# Patient Record
Sex: Male | Born: 1955 | Race: White | Hispanic: No | Marital: Married | Smoking: Former smoker
Health system: Southern US, Community
[De-identification: ages and names within clinical notes are randomized; demographics above are authoritative.]

## PROBLEM LIST (undated history)

## (undated) DIAGNOSIS — J45909 Unspecified asthma, uncomplicated: Secondary | ICD-10-CM

## (undated) DIAGNOSIS — F419 Anxiety disorder, unspecified: Secondary | ICD-10-CM

## (undated) DIAGNOSIS — M1990 Unspecified osteoarthritis, unspecified site: Secondary | ICD-10-CM

## (undated) DIAGNOSIS — G4733 Obstructive sleep apnea (adult) (pediatric): Secondary | ICD-10-CM

## (undated) DIAGNOSIS — E119 Type 2 diabetes mellitus without complications: Secondary | ICD-10-CM

## (undated) DIAGNOSIS — R0602 Shortness of breath: Secondary | ICD-10-CM

## (undated) DIAGNOSIS — E039 Hypothyroidism, unspecified: Secondary | ICD-10-CM

## (undated) DIAGNOSIS — Z8719 Personal history of other diseases of the digestive system: Secondary | ICD-10-CM

## (undated) DIAGNOSIS — I10 Essential (primary) hypertension: Secondary | ICD-10-CM

## (undated) DIAGNOSIS — K219 Gastro-esophageal reflux disease without esophagitis: Secondary | ICD-10-CM

## (undated) DIAGNOSIS — E7800 Pure hypercholesterolemia, unspecified: Secondary | ICD-10-CM

## (undated) DIAGNOSIS — F329 Major depressive disorder, single episode, unspecified: Secondary | ICD-10-CM

## (undated) DIAGNOSIS — Z9989 Dependence on other enabling machines and devices: Secondary | ICD-10-CM

## (undated) DIAGNOSIS — J189 Pneumonia, unspecified organism: Secondary | ICD-10-CM

## (undated) DIAGNOSIS — F32A Depression, unspecified: Secondary | ICD-10-CM

## (undated) HISTORY — PX: TONSILLECTOMY: SUR1361

## (undated) HISTORY — PX: CYST EXCISION: SHX5701

## (undated) HISTORY — PX: INGUINAL HERNIA REPAIR: SUR1180

## (undated) HISTORY — PX: COLONOSCOPY: SHX174

---

## 1991-03-31 DIAGNOSIS — J189 Pneumonia, unspecified organism: Secondary | ICD-10-CM

## 1991-03-31 HISTORY — DX: Pneumonia, unspecified organism: J18.9

## 2013-06-22 ENCOUNTER — Encounter (HOSPITAL_COMMUNITY): Payer: Self-pay | Admitting: Emergency Medicine

## 2013-06-22 ENCOUNTER — Encounter (HOSPITAL_COMMUNITY): Payer: Worker's Compensation | Admitting: Anesthesiology

## 2013-06-22 ENCOUNTER — Inpatient Hospital Stay (HOSPITAL_COMMUNITY): Payer: Worker's Compensation

## 2013-06-22 ENCOUNTER — Other Ambulatory Visit: Payer: Self-pay

## 2013-06-22 ENCOUNTER — Emergency Department (HOSPITAL_COMMUNITY): Payer: Worker's Compensation

## 2013-06-22 ENCOUNTER — Inpatient Hospital Stay (HOSPITAL_COMMUNITY)
Admission: EM | Admit: 2013-06-22 | Discharge: 2013-06-28 | DRG: 494 | Disposition: A | Discharge status: Home / Self Care | Payer: Worker's Compensation | Attending: Orthopedic Surgery | Admitting: Orthopedic Surgery

## 2013-06-22 ENCOUNTER — Encounter (HOSPITAL_COMMUNITY)
Admission: EM | Disposition: A | Discharge status: Home / Self Care | Payer: Self-pay | Source: Home / Self Care | Attending: Orthopedic Surgery

## 2013-06-22 ENCOUNTER — Inpatient Hospital Stay (HOSPITAL_COMMUNITY): Payer: Worker's Compensation | Admitting: Anesthesiology

## 2013-06-22 DIAGNOSIS — S82899A Other fracture of unspecified lower leg, initial encounter for closed fracture: Secondary | ICD-10-CM | POA: Diagnosis present

## 2013-06-22 DIAGNOSIS — Z87891 Personal history of nicotine dependence: Secondary | ICD-10-CM | POA: Diagnosis not present

## 2013-06-22 DIAGNOSIS — S43429A Sprain of unspecified rotator cuff capsule, initial encounter: Secondary | ICD-10-CM | POA: Diagnosis present

## 2013-06-22 DIAGNOSIS — S8253XA Displaced fracture of medial malleolus of unspecified tibia, initial encounter for closed fracture: Secondary | ICD-10-CM | POA: Diagnosis present

## 2013-06-22 DIAGNOSIS — S82839A Other fracture of upper and lower end of unspecified fibula, initial encounter for closed fracture: Secondary | ICD-10-CM | POA: Diagnosis present

## 2013-06-22 DIAGNOSIS — Z6832 Body mass index (BMI) 32.0-32.9, adult: Secondary | ICD-10-CM | POA: Diagnosis not present

## 2013-06-22 DIAGNOSIS — I10 Essential (primary) hypertension: Secondary | ICD-10-CM | POA: Diagnosis present

## 2013-06-22 DIAGNOSIS — F3289 Other specified depressive episodes: Secondary | ICD-10-CM | POA: Diagnosis present

## 2013-06-22 DIAGNOSIS — G4733 Obstructive sleep apnea (adult) (pediatric): Secondary | ICD-10-CM | POA: Diagnosis present

## 2013-06-22 DIAGNOSIS — G4700 Insomnia, unspecified: Secondary | ICD-10-CM

## 2013-06-22 DIAGNOSIS — W19XXXA Unspecified fall, initial encounter: Secondary | ICD-10-CM | POA: Diagnosis present

## 2013-06-22 DIAGNOSIS — F32A Depression, unspecified: Secondary | ICD-10-CM

## 2013-06-22 DIAGNOSIS — S82872A Displaced pilon fracture of left tibia, initial encounter for closed fracture: Secondary | ICD-10-CM

## 2013-06-22 DIAGNOSIS — E669 Obesity, unspecified: Secondary | ICD-10-CM | POA: Diagnosis present

## 2013-06-22 DIAGNOSIS — F064 Anxiety disorder due to known physiological condition: Secondary | ICD-10-CM | POA: Diagnosis present

## 2013-06-22 DIAGNOSIS — E7800 Pure hypercholesterolemia, unspecified: Secondary | ICD-10-CM | POA: Diagnosis present

## 2013-06-22 DIAGNOSIS — K219 Gastro-esophageal reflux disease without esophagitis: Secondary | ICD-10-CM | POA: Diagnosis present

## 2013-06-22 DIAGNOSIS — F329 Major depressive disorder, single episode, unspecified: Secondary | ICD-10-CM | POA: Diagnosis present

## 2013-06-22 DIAGNOSIS — F411 Generalized anxiety disorder: Secondary | ICD-10-CM

## 2013-06-22 DIAGNOSIS — S82832A Other fracture of upper and lower end of left fibula, initial encounter for closed fracture: Secondary | ICD-10-CM

## 2013-06-22 DIAGNOSIS — S82892A Other fracture of left lower leg, initial encounter for closed fracture: Secondary | ICD-10-CM

## 2013-06-22 DIAGNOSIS — S82302A Unspecified fracture of lower end of left tibia, initial encounter for closed fracture: Secondary | ICD-10-CM

## 2013-06-22 HISTORY — DX: Depression, unspecified: F32.A

## 2013-06-22 HISTORY — DX: Gastro-esophageal reflux disease without esophagitis: K21.9

## 2013-06-22 HISTORY — DX: Dependence on other enabling machines and devices: Z99.89

## 2013-06-22 HISTORY — PX: EXTERNAL FIXATION LEG: SHX1549

## 2013-06-22 HISTORY — PX: TIBIA FRACTURE SURGERY: SHX806

## 2013-06-22 HISTORY — DX: Hemochromatosis, unspecified: E83.119

## 2013-06-22 HISTORY — DX: Essential (primary) hypertension: I10

## 2013-06-22 HISTORY — DX: Pure hypercholesterolemia, unspecified: E78.00

## 2013-06-22 HISTORY — DX: Major depressive disorder, single episode, unspecified: F32.9

## 2013-06-22 HISTORY — DX: Personal history of other diseases of the digestive system: Z87.19

## 2013-06-22 HISTORY — DX: Obstructive sleep apnea (adult) (pediatric): G47.33

## 2013-06-22 HISTORY — DX: Anxiety disorder, unspecified: F41.9

## 2013-06-22 LAB — PROTIME-INR
INR: 0.93 (ref 0.00–1.49)
PROTHROMBIN TIME: 12.3 s (ref 11.6–15.2)

## 2013-06-22 LAB — URINALYSIS, ROUTINE W REFLEX MICROSCOPIC
Bilirubin Urine: NEGATIVE
Glucose, UA: NEGATIVE mg/dL
Hgb urine dipstick: NEGATIVE
Ketones, ur: NEGATIVE mg/dL
LEUKOCYTES UA: NEGATIVE
Nitrite: NEGATIVE
PROTEIN: NEGATIVE mg/dL
Specific Gravity, Urine: 1.029 (ref 1.005–1.030)
Urobilinogen, UA: 0.2 mg/dL (ref 0.0–1.0)
pH: 5 (ref 5.0–8.0)

## 2013-06-22 LAB — CBC
HCT: 40.6 % (ref 39.0–52.0)
HCT: 41.6 % (ref 39.0–52.0)
Hemoglobin: 14.7 g/dL (ref 13.0–17.0)
Hemoglobin: 14.5 g/dL (ref 13.0–17.0)
MCH: 33.9 pg (ref 26.0–34.0)
MCH: 33.1 pg (ref 26.0–34.0)
MCHC: 36.2 g/dL — ABNORMAL HIGH (ref 30.0–36.0)
MCHC: 34.9 g/dL (ref 30.0–36.0)
MCV: 93.5 fL (ref 78.0–100.0)
MCV: 95 fL (ref 78.0–100.0)
PLATELETS: 199 10*3/uL (ref 150–400)
PLATELETS: 200 10*3/uL (ref 150–400)
RBC: 4.34 MIL/uL (ref 4.22–5.81)
RBC: 4.38 MIL/uL (ref 4.22–5.81)
RDW: 13.5 % (ref 11.5–15.5)
RDW: 13.9 % (ref 11.5–15.5)
WBC: 10.8 10*3/uL — AB (ref 4.0–10.5)
WBC: 12.5 10*3/uL — AB (ref 4.0–10.5)

## 2013-06-22 LAB — TYPE AND SCREEN
ABO/RH(D): A POS
ANTIBODY SCREEN: NEGATIVE

## 2013-06-22 LAB — APTT: APTT: 28 s (ref 24–37)

## 2013-06-22 LAB — BASIC METABOLIC PANEL
BUN: 18 mg/dL (ref 6–23)
CALCIUM: 9.2 mg/dL (ref 8.4–10.5)
CHLORIDE: 103 meq/L (ref 96–112)
CO2: 23 mEq/L (ref 19–32)
Creatinine, Ser: 0.79 mg/dL (ref 0.50–1.35)
GFR calc Af Amer: >90 mL/min (ref >90)
GFR calc non Af Amer: >90 mL/min (ref >90)
Glucose, Bld: 179 mg/dL — ABNORMAL HIGH (ref 70–99)
POTASSIUM: 4.2 meq/L (ref 3.7–5.3)
Sodium: 139 mEq/L (ref 137–147)

## 2013-06-22 LAB — CREATININE, SERUM
CREATININE: 0.77 mg/dL (ref 0.50–1.35)
GFR calc non Af Amer: >90 mL/min (ref >90)

## 2013-06-22 LAB — ABO/RH: ABO/RH(D): A POS

## 2013-06-22 SURGERY — EXTERNAL FIXATION, LOWER EXTREMITY
Anesthesia: General | Site: Ankle | Laterality: Left

## 2013-06-22 MED ORDER — GLYCOPYRROLATE 0.2 MG/ML IJ SOLN
INTRAMUSCULAR | Status: AC
  Filled 2013-06-22: qty 2

## 2013-06-22 MED ORDER — FENTANYL CITRATE 0.05 MG/ML IJ SOLN
INTRAMUSCULAR | Status: DC | PRN
  Administered 2013-06-22 (×2): 50 ug via INTRAVENOUS
  Administered 2013-06-22: 150 ug via INTRAVENOUS

## 2013-06-22 MED ORDER — MEPERIDINE HCL 25 MG/ML IJ SOLN
INTRAMUSCULAR | Status: AC
  Filled 2013-06-22: qty 1

## 2013-06-22 MED ORDER — FENTANYL CITRATE 0.05 MG/ML IJ SOLN
50.0000 ug | Freq: Once | INTRAMUSCULAR | Status: AC
  Administered 2013-06-22: 50 ug via INTRAVENOUS
  Filled 2013-06-22: qty 2

## 2013-06-22 MED ORDER — ONDANSETRON HCL 4 MG PO TABS: 4.0000 mg | ORAL_TABLET | Freq: Four times a day (QID) | ORAL | Status: DC | PRN

## 2013-06-22 MED ORDER — HYDROMORPHONE HCL PF 1 MG/ML IJ SOLN
1.0000 mg | INTRAMUSCULAR | Status: AC | PRN
Start: 2013-06-22 — End: 2013-06-22
  Administered 2013-06-22: 1 mg via INTRAVENOUS
  Filled 2013-06-22: qty 1

## 2013-06-22 MED ORDER — ONDANSETRON HCL 4 MG/2ML IJ SOLN
4.0000 mg | Freq: Four times a day (QID) | INTRAMUSCULAR | Status: DC | PRN
  Administered 2013-06-24: 4 mg via INTRAVENOUS
  Filled 2013-06-22: qty 2

## 2013-06-22 MED ORDER — METHOCARBAMOL 100 MG/ML IJ SOLN: 500.0000 mg | Freq: Four times a day (QID) | INTRAVENOUS | Status: DC | PRN

## 2013-06-22 MED ORDER — CITALOPRAM HYDROBROMIDE 10 MG PO TABS
10.0000 mg | ORAL_TABLET | Freq: Every day | ORAL | Status: DC
  Administered 2013-06-23 – 2013-06-27 (×5): 10 mg via ORAL
  Filled 2013-06-22 (×5): qty 1

## 2013-06-22 MED ORDER — METOCLOPRAMIDE HCL 10 MG PO TABS
5.0000 mg | ORAL_TABLET | Freq: Three times a day (TID) | ORAL | Status: DC | PRN
Start: 2013-06-22 — End: 2013-06-28

## 2013-06-22 MED ORDER — MIDAZOLAM HCL 5 MG/5ML IJ SOLN
INTRAMUSCULAR | Status: DC | PRN
  Administered 2013-06-22: 2 mg via INTRAVENOUS

## 2013-06-22 MED ORDER — MEPERIDINE HCL 25 MG/ML IJ SOLN
6.2500 mg | INTRAMUSCULAR | Status: DC | PRN
Start: 2013-06-22 — End: 2013-06-22
  Administered 2013-06-22: 12.5 mg via INTRAVENOUS

## 2013-06-22 MED ORDER — LACTATED RINGERS IV SOLN
INTRAVENOUS | Status: DC | PRN
  Administered 2013-06-22: 13:00:00 via INTRAVENOUS

## 2013-06-22 MED ORDER — ONDANSETRON HCL 4 MG/2ML IJ SOLN
4.0000 mg | Freq: Once | INTRAMUSCULAR | Status: AC
  Administered 2013-06-22: 4 mg via INTRAVENOUS
  Filled 2013-06-22: qty 2

## 2013-06-22 MED ORDER — LISINOPRIL-HYDROCHLOROTHIAZIDE 20-25 MG PO TABS: 1.0000 | ORAL_TABLET | Freq: Every day | ORAL | Status: DC

## 2013-06-22 MED ORDER — PROPOFOL 10 MG/ML IV BOLUS
INTRAVENOUS | Status: DC | PRN
  Administered 2013-06-22: 200 mg via INTRAVENOUS

## 2013-06-22 MED ORDER — LISINOPRIL 20 MG PO TABS
20.0000 mg | ORAL_TABLET | Freq: Every day | ORAL | Status: DC
  Administered 2013-06-23 – 2013-06-28 (×5): 20 mg via ORAL
  Filled 2013-06-22 (×6): qty 1

## 2013-06-22 MED ORDER — 0.9 % SODIUM CHLORIDE (POUR BTL) OPTIME
TOPICAL | Status: DC | PRN
  Administered 2013-06-22: 1000 mL

## 2013-06-22 MED ORDER — CEFAZOLIN SODIUM-DEXTROSE 2-3 GM-% IV SOLR
2.0000 g | Freq: Three times a day (TID) | INTRAVENOUS | Status: DC
  Administered 2013-06-22 – 2013-06-27 (×14): 2 g via INTRAVENOUS
  Filled 2013-06-22 (×19): qty 50

## 2013-06-22 MED ORDER — FENTANYL CITRATE 0.05 MG/ML IJ SOLN
INTRAMUSCULAR | Status: AC
  Filled 2013-06-22: qty 5

## 2013-06-22 MED ORDER — METHOCARBAMOL 500 MG PO TABS
500.0000 mg | ORAL_TABLET | Freq: Four times a day (QID) | ORAL | Status: DC | PRN
  Filled 2013-06-22 (×2): qty 1

## 2013-06-22 MED ORDER — HYDROMORPHONE HCL PF 1 MG/ML IJ SOLN
1.0000 mg | INTRAMUSCULAR | Status: DC | PRN
  Administered 2013-06-22 – 2013-06-23 (×4): 1 mg via INTRAVENOUS
  Administered 2013-06-23: 2 mg via INTRAVENOUS
  Administered 2013-06-23: 1 mg via INTRAVENOUS
  Administered 2013-06-23: 2 mg via INTRAVENOUS
  Administered 2013-06-23 (×2): 1 mg via INTRAVENOUS
  Administered 2013-06-23 – 2013-06-24 (×4): 2 mg via INTRAVENOUS
  Administered 2013-06-24: 1 mg via INTRAVENOUS
  Administered 2013-06-24: 2 mg via INTRAVENOUS
  Administered 2013-06-24: 1 mg via INTRAVENOUS
  Administered 2013-06-24 (×2): 2 mg via INTRAVENOUS
  Administered 2013-06-25 – 2013-06-26 (×4): 1 mg via INTRAVENOUS
  Filled 2013-06-22 (×2): qty 2
  Filled 2013-06-22 (×5): qty 1
  Filled 2013-06-22 (×3): qty 2
  Filled 2013-06-22 (×5): qty 1
  Filled 2013-06-22 (×2): qty 2
  Filled 2013-06-22 (×3): qty 1
  Filled 2013-06-22 (×3): qty 2

## 2013-06-22 MED ORDER — CEFAZOLIN SODIUM-DEXTROSE 2-3 GM-% IV SOLR: 2.0000 g | INTRAVENOUS | Status: DC

## 2013-06-22 MED ORDER — SODIUM CHLORIDE 0.9 % IV SOLN
Freq: Once | INTRAVENOUS | Status: AC
  Administered 2013-06-22: 07:00:00 via INTRAVENOUS

## 2013-06-22 MED ORDER — METHOCARBAMOL 100 MG/ML IJ SOLN
500.0000 mg | Freq: Four times a day (QID) | INTRAVENOUS | Status: DC | PRN
  Filled 2013-06-22: qty 5

## 2013-06-22 MED ORDER — HYDROMORPHONE HCL PF 1 MG/ML IJ SOLN
INTRAMUSCULAR | Status: AC
  Filled 2013-06-22: qty 1

## 2013-06-22 MED ORDER — OXYCODONE HCL 5 MG PO TABS
ORAL_TABLET | ORAL | Status: AC
  Filled 2013-06-22: qty 2

## 2013-06-22 MED ORDER — ONDANSETRON HCL 4 MG/2ML IJ SOLN: 4.0000 mg | Freq: Four times a day (QID) | INTRAMUSCULAR | Status: DC | PRN

## 2013-06-22 MED ORDER — ONDANSETRON HCL 4 MG/2ML IJ SOLN: 4.0000 mg | Freq: Three times a day (TID) | INTRAMUSCULAR | Status: AC | PRN

## 2013-06-22 MED ORDER — NEOSTIGMINE METHYLSULFATE 1 MG/ML IJ SOLN
INTRAMUSCULAR | Status: DC | PRN
  Administered 2013-06-22: 3 mg via INTRAVENOUS

## 2013-06-22 MED ORDER — HYDROCHLOROTHIAZIDE 25 MG PO TABS
25.0000 mg | ORAL_TABLET | Freq: Every day | ORAL | Status: DC
  Administered 2013-06-23 – 2013-06-28 (×5): 25 mg via ORAL
  Filled 2013-06-22 (×6): qty 1

## 2013-06-22 MED ORDER — ENOXAPARIN SODIUM 40 MG/0.4ML ~~LOC~~ SOLN
40.0000 mg | SUBCUTANEOUS | Status: DC
  Administered 2013-06-22 – 2013-06-27 (×6): 40 mg via SUBCUTANEOUS
  Filled 2013-06-22 (×7): qty 0.4

## 2013-06-22 MED ORDER — LIDOCAINE HCL (CARDIAC) 20 MG/ML IV SOLN
INTRAVENOUS | Status: DC | PRN
  Administered 2013-06-22: 40 mg via INTRAVENOUS

## 2013-06-22 MED ORDER — PROPOFOL 10 MG/ML IV BOLUS
INTRAVENOUS | Status: AC
  Filled 2013-06-22: qty 20

## 2013-06-22 MED ORDER — MORPHINE SULFATE 4 MG/ML IJ SOLN
4.0000 mg | INTRAMUSCULAR | Status: DC | PRN
  Administered 2013-06-22: 4 mg via INTRAVENOUS
  Filled 2013-06-22: qty 1

## 2013-06-22 MED ORDER — HYDROMORPHONE HCL PF 1 MG/ML IJ SOLN
0.2500 mg | INTRAMUSCULAR | Status: DC | PRN
  Administered 2013-06-22: 1 mg via INTRAVENOUS

## 2013-06-22 MED ORDER — ONDANSETRON HCL 4 MG/2ML IJ SOLN
INTRAMUSCULAR | Status: DC | PRN
  Administered 2013-06-22: 4 mg via INTRAVENOUS

## 2013-06-22 MED ORDER — METHOCARBAMOL 500 MG PO TABS
ORAL_TABLET | ORAL | Status: AC
  Filled 2013-06-22: qty 1

## 2013-06-22 MED ORDER — METHOCARBAMOL 500 MG PO TABS
500.0000 mg | ORAL_TABLET | Freq: Four times a day (QID) | ORAL | Status: DC | PRN
  Administered 2013-06-22 – 2013-06-28 (×12): 500 mg via ORAL
  Filled 2013-06-22 (×11): qty 1

## 2013-06-22 MED ORDER — CEFAZOLIN SODIUM-DEXTROSE 2-3 GM-% IV SOLR
INTRAVENOUS | Status: DC | PRN
  Administered 2013-06-22: 2 g via INTRAVENOUS

## 2013-06-22 MED ORDER — SIMVASTATIN 10 MG PO TABS
10.0000 mg | ORAL_TABLET | Freq: Every day | ORAL | Status: DC
  Administered 2013-06-22 – 2013-06-28 (×7): 10 mg via ORAL
  Filled 2013-06-22 (×7): qty 1

## 2013-06-22 MED ORDER — ROCURONIUM BROMIDE 100 MG/10ML IV SOLN
INTRAVENOUS | Status: DC | PRN
  Administered 2013-06-22: 30 mg via INTRAVENOUS

## 2013-06-22 MED ORDER — SODIUM CHLORIDE 0.9 % IV SOLN
INTRAVENOUS | Status: DC
  Administered 2013-06-23: 22:00:00 via INTRAVENOUS

## 2013-06-22 MED ORDER — METOCLOPRAMIDE HCL 5 MG/ML IJ SOLN: 5.0000 mg | Freq: Three times a day (TID) | INTRAMUSCULAR | Status: DC | PRN

## 2013-06-22 MED ORDER — IBUPROFEN 800 MG PO TABS: 800.0000 mg | ORAL_TABLET | Freq: Every day | ORAL | Status: DC | PRN

## 2013-06-22 MED ORDER — MIDAZOLAM HCL 2 MG/2ML IJ SOLN
INTRAMUSCULAR | Status: AC
  Filled 2013-06-22: qty 2

## 2013-06-22 MED ORDER — NEOSTIGMINE METHYLSULFATE 1 MG/ML IJ SOLN
INTRAMUSCULAR | Status: AC
  Filled 2013-06-22: qty 10

## 2013-06-22 MED ORDER — OXYCODONE HCL 5 MG/5ML PO SOLN: 5.0000 mg | Freq: Once | ORAL | Status: DC | PRN

## 2013-06-22 MED ORDER — LIDOCAINE HCL (CARDIAC) 20 MG/ML IV SOLN
INTRAVENOUS | Status: AC
  Filled 2013-06-22: qty 5

## 2013-06-22 MED ORDER — PANTOPRAZOLE SODIUM 40 MG PO TBEC
80.0000 mg | DELAYED_RELEASE_TABLET | Freq: Every day | ORAL | Status: DC
  Administered 2013-06-23 – 2013-06-28 (×6): 80 mg via ORAL
  Filled 2013-06-22 (×4): qty 2

## 2013-06-22 MED ORDER — OXYCODONE HCL 5 MG PO TABS: 5.0000 mg | ORAL_TABLET | Freq: Once | ORAL | Status: DC | PRN

## 2013-06-22 MED ORDER — CHLORHEXIDINE GLUCONATE 4 % EX LIQD: 60.0000 mL | Freq: Once | CUTANEOUS | Status: DC

## 2013-06-22 MED ORDER — ONDANSETRON HCL 4 MG/2ML IJ SOLN
INTRAMUSCULAR | Status: AC
  Filled 2013-06-22: qty 2

## 2013-06-22 MED ORDER — LACTATED RINGERS IV SOLN
INTRAVENOUS | Status: DC
  Administered 2013-06-22: 12:00:00 via INTRAVENOUS

## 2013-06-22 MED ORDER — ROCURONIUM BROMIDE 50 MG/5ML IV SOLN
INTRAVENOUS | Status: AC
  Filled 2013-06-22: qty 1

## 2013-06-22 MED ORDER — OXYCODONE HCL 5 MG PO TABS
5.0000 mg | ORAL_TABLET | ORAL | Status: DC | PRN
  Administered 2013-06-22 – 2013-06-24 (×9): 10 mg via ORAL
  Filled 2013-06-22 (×8): qty 2

## 2013-06-22 MED ORDER — GLYCOPYRROLATE 0.2 MG/ML IJ SOLN
INTRAMUSCULAR | Status: DC | PRN
  Administered 2013-06-22: 0.4 mg via INTRAVENOUS

## 2013-06-22 MED ORDER — SODIUM CHLORIDE 0.9 % IV SOLN: INTRAVENOUS | Status: DC

## 2013-06-22 SURGICAL SUPPLY — 57 items
BANDAGE ELASTIC 4 VELCRO ST LF (GAUZE/BANDAGES/DRESSINGS) ×2 IMPLANT
BANDAGE ELASTIC 6 VELCRO ST LF (GAUZE/BANDAGES/DRESSINGS) ×2 IMPLANT
BANDAGE ESMARK 6X9 LF (GAUZE/BANDAGES/DRESSINGS) ×1 IMPLANT
BANDAGE GAUZE ELAST BULKY 4 IN (GAUZE/BANDAGES/DRESSINGS) ×2 IMPLANT
BIT DRILL 3.2X240 A/O LONG (BIT) ×2 IMPLANT
BNDG ESMARK 6X9 LF (GAUZE/BANDAGES/DRESSINGS) ×2
CLAMP 2 3/5OST (Clamp) ×4 IMPLANT
CLAMP 5 HOLE (Clamp) ×2 IMPLANT
CLAMP PIN-ROD (Clamp) ×4 IMPLANT
CLAMP ROD-ROD (Clamp) ×4 IMPLANT
CUFF TOURNIQUET SINGLE 34IN LL (TOURNIQUET CUFF) IMPLANT
CUFF TOURNIQUET SINGLE 44IN (TOURNIQUET CUFF) IMPLANT
DECANTER SPIKE VIAL GLASS SM (MISCELLANEOUS) ×2 IMPLANT
DRAPE OEC MINIVIEW 54X84 (DRAPES) IMPLANT
DRAPE U-SHAPE 47X51 STRL (DRAPES) ×2 IMPLANT
DRSG ADAPTIC 3X8 NADH LF (GAUZE/BANDAGES/DRESSINGS) ×2 IMPLANT
DRSG PAD ABDOMINAL 8X10 ST (GAUZE/BANDAGES/DRESSINGS) ×2 IMPLANT
DURAPREP 26ML APPLICATOR (WOUND CARE) ×2 IMPLANT
ELECT REM PT RETURN 9FT ADLT (ELECTROSURGICAL) ×2
ELECTRODE REM PT RTRN 9FT ADLT (ELECTROSURGICAL) ×1 IMPLANT
FACESHIELD LNG OPTICON STERILE (SAFETY) ×2 IMPLANT
GAUZE XEROFORM 1X8 LF (GAUZE/BANDAGES/DRESSINGS) ×2 IMPLANT
GLOVE BIOGEL PI ORTHO PRO 7.5 (GLOVE) ×1
GLOVE BIOGEL PI ORTHO PRO SZ8 (GLOVE) ×1
GLOVE ORTHO TXT STRL SZ7.5 (GLOVE) ×2 IMPLANT
GLOVE PI ORTHO PRO STRL 7.5 (GLOVE) ×1 IMPLANT
GLOVE PI ORTHO PRO STRL SZ8 (GLOVE) ×1 IMPLANT
GLOVE SURG ORTHO 8.5 STRL (GLOVE) ×2 IMPLANT
GOWN STRL REUS W/ TWL LRG LVL3 (GOWN DISPOSABLE) ×3 IMPLANT
GOWN STRL REUS W/ TWL XL LVL3 (GOWN DISPOSABLE) ×2 IMPLANT
GOWN STRL REUS W/TWL LRG LVL3 (GOWN DISPOSABLE) ×3
GOWN STRL REUS W/TWL XL LVL3 (GOWN DISPOSABLE) ×2
KIT BASIN OR (CUSTOM PROCEDURE TRAY) ×2 IMPLANT
KIT ROOM TURNOVER OR (KITS) ×2 IMPLANT
MANIFOLD NEPTUNE II (INSTRUMENTS) ×2 IMPLANT
NS IRRIG 1000ML POUR BTL (IV SOLUTION) ×2 IMPLANT
PACK ORTHO EXTREMITY (CUSTOM PROCEDURE TRAY) ×2 IMPLANT
PAD ARMBOARD 7.5X6 YLW CONV (MISCELLANEOUS) ×4 IMPLANT
PAD CAST 4YDX4 CTTN HI CHSV (CAST SUPPLIES) ×2 IMPLANT
PADDING CAST COTTON 4X4 STRL (CAST SUPPLIES) ×2
PIN GUIDE DEPUY (PIN) ×4 IMPLANT
SCREW BONE 5MMX180MM (Screw) ×2 IMPLANT
SCREW BONE SELF DRILL/TAP5X150 (Screw) ×4 IMPLANT
SCREW TRANSFIXING 6X350MM (Screw) ×2 IMPLANT
SPONGE GAUZE 4X4 12PLY (GAUZE/BANDAGES/DRESSINGS) ×2 IMPLANT
SPONGE LAP 4X18 X RAY DECT (DISPOSABLE) ×4 IMPLANT
STAPLER VISISTAT 35W (STAPLE) ×2 IMPLANT
SUCTION FRAZIER TIP 10 FR DISP (SUCTIONS) ×2 IMPLANT
SUT ETHILON 3 0 FSL (SUTURE) ×4 IMPLANT
SUT VIC AB 2-0 CT1 27 (SUTURE) ×2
SUT VIC AB 2-0 CT1 TAPERPNT 27 (SUTURE) ×2 IMPLANT
SUT VIC AB 3-0 CT1 27 (SUTURE) ×1
SUT VIC AB 3-0 CT1 TAPERPNT 27 (SUTURE) ×1 IMPLANT
TOWEL OR 17X24 6PK STRL BLUE (TOWEL DISPOSABLE) ×2 IMPLANT
TOWEL OR 17X26 10 PK STRL BLUE (TOWEL DISPOSABLE) ×2 IMPLANT
TUBE CONNECTING 12X1/4 (SUCTIONS) ×2 IMPLANT
WATER STERILE IRR 1000ML POUR (IV SOLUTION) ×2 IMPLANT

## 2013-06-22 NOTE — Progress Notes (Signed)
Orthopedic Tech Progress Note Patient Details:  Parker Bond 08-10-1955 [id number]  Ortho Devices Type of Ortho Device: Ace wrap;Stirrup splint;Post (short leg) splint Ortho Device/Splint Location: put ohf on bed Ortho Device/Splint Interventions: Ordered;Application   Parker Bond, Parker Bond Craig 06/22/2013, 8:40 PM

## 2013-06-22 NOTE — ED Notes (Signed)
[place], PA back in with pt.

## 2013-06-22 NOTE — Preoperative (Signed)
Beta Blockers   Reason not to administer Beta Blockers:Not Applicable 

## 2013-06-22 NOTE — ED Notes (Signed)
Pt fell out of tractor trailer and thinks he broke his left ankle. Pms intact

## 2013-06-22 NOTE — ED Notes (Signed)
Phlebotomy at the bedside  

## 2013-06-22 NOTE — Anesthesia Preprocedure Evaluation (Signed)
Anesthesia Evaluation  Patient identified by MRN, date of birth, ID band Patient awake    Reviewed: Allergy & Precautions, H&P , NPO status , Patient's Chart, lab work & pertinent test results  Airway Mallampati: II  Neck ROM: full    Dental   Pulmonary sleep apnea ,          Cardiovascular hypertension,     Neuro/Psych    GI/Hepatic   Endo/Other  obese  Renal/GU      Musculoskeletal   Abdominal   Peds  Hematology   Anesthesia Other Findings   Reproductive/Obstetrics                           Anesthesia Physical Anesthesia Plan  ASA: II  Anesthesia Plan: General   Post-op Pain Management:    Induction: Intravenous  Airway Management Planned: LMA  Additional Equipment:   Intra-op Plan:   Post-operative Plan:   Informed Consent: I have reviewed the patients History and Physical, chart, labs and discussed the procedure including the risks, benefits and alternatives for the proposed anesthesia with the patient or authorized representative who has indicated his/her understanding and acceptance.     Plan Discussed with: CRNA, Anesthesiologist and Surgeon  Anesthesia Plan Comments:         Anesthesia Quick Evaluation

## 2013-06-22 NOTE — ED Notes (Signed)
Received report from KeithsburgHayley, Charity fundraiserN.  Pt to transfer to [facility].

## 2013-06-22 NOTE — ED Provider Notes (Signed)
CSN: [id number]     Arrival date & time 06/22/13  0453 History   First MD Initiated Contact with Patient 06/22/13 [phone]     Chief Complaint  Patient presents with  . Ankle Pain     (Consider location/radiation/quality/duration/timing/severity/associated sxs/prior Treatment) HPI  This is a 58 year old male with history of hypertension and sleep apnea presents for evaluation of left ankle injury. Patient was at work earlier today, while climbing up on a step ladder in his truck, he lost balance, and had to jump off in order to avoid falling on his head. He twisted his left ankle in the process, fell down extending right hand to break the fall. He denies hitting his head or loss of consciousness. He complaining of mild tenderness to right hand, but reports significant pain to left ankle. Pain is described as a sharp and throbbing sensation, persistent, worsening with movement. He was unable to bear weight. Denies prior injury to the same ankle. Denies any other injury. Denies any numbness. No precipitating symptoms prior to the fall. No specific treatment tried. This incident happened 2 and half hours ago. Last meal was 4 hrs ago.   Past Medical History  Diagnosis Date  . Hypertension   . Sleep apnea    History reviewed. No pertinent past surgical history. History reviewed. No pertinent family history. History  Substance Use Topics  . Smoking status: Never Smoker   . Smokeless tobacco: Not on file  . Alcohol Use: Yes    Review of Systems  Constitutional: Negative for fever.  Musculoskeletal: Positive for arthralgias.  Skin: Negative for wound.  Neurological: Negative for numbness.      Allergies  Contrast media  Home Medications   Current Outpatient Rx  Name  Route  Sig  Dispense  Refill  . acetaminophen (TYLENOL) 500 MG tablet   Oral   Take 1,000-2,000 mg by mouth 2 (two) times daily as needed for moderate pain.         . citalopram (CELEXA) 10 MG tablet   Oral   Take  10 mg by mouth daily.         Marland Kitchen. ibuprofen (ADVIL,MOTRIN) 200 MG tablet   Oral   Take 800 mg by mouth daily as needed for mild pain.         Marland Kitchen. lisinopril-hydrochlorothiazide (PRINZIDE,ZESTORETIC) 20-25 MG per tablet   Oral   Take 1 tablet by mouth daily.         Marland Kitchen. omeprazole (PRILOSEC) 40 MG capsule   Oral   Take 40 mg by mouth daily.         . pravastatin (PRAVACHOL) 20 MG tablet   Oral   Take 20 mg by mouth daily.          Marland Kitchen. PRESCRIPTION MEDICATION   Oral   Take 1-2 capsules by mouth at bedtime.          BP 143/68  Pulse 82  Temp(Src) 98.1 F (36.7 C) (Oral)  Resp 16  Ht 6' (1.829 m)  Wt 240 lb (108.863 kg)  BMI 32.54 kg/m2  SpO2 96% Physical Exam  Constitutional: He appears well-developed and well-nourished. No distress.  HENT:  Head: Atraumatic.  Eyes: Conjunctivae are normal.  Neck: Normal range of motion. Neck supple.  Musculoskeletal: He exhibits tenderness (L ankle: exquisite tenderness to medial/lateral/posterior malleolar with associate warmth and swelling.  crepitus noted with deformity.  decreased ROM 2/2 pain).  R hand with normal grip, no deformity, no pain to anatomical snuff  box.  No obvious injury.  R wrist with FROM, radial pulse 2+  L ankle with 2+ dorsalis pedis. Sensation intact distal to L ankle injury.  No open laceration.    L knee without tenderness.  ROM not tested due to ankle pain.   Neurological: He is alert.  Skin: No rash noted.  Psychiatric: He has a normal mood and affect.    ED Course  Procedures (including critical care time)  6:57 AM Patient with mechanical injury to left ankle. X-ray demonstrated a severely impacted and comminuted fractures of the distal tibia, with nondisplaced fracture of the tip of the lateral malleolus. This is a closed fracture. Patient is neurovascularly intact. I have consult orthopedic surgeon, Dr. Ranell Patrick, who request for splinting and he will see the patient in the ER for further management.  Care discussed with Dr. Lavella Lemons.  7:53 AM Posterior and stirrup splint applied.  Pt is neurovascularly intact post splinting.  Dr. Ranell Patrick has seen and evaluate pt and will admit for surgical intervention.    Labs Review Labs Reviewed - No data to display Imaging Review Dg Ankle Complete Left  06/22/2013   CLINICAL DATA:  History of fall complaining of ankle pain.  EXAM: LEFT ANKLE COMPLETE - 3+ VIEW  COMPARISON:  No priors.  FINDINGS: Severely comminuted fracture of the distal tibia with multiple fracture fragments which appear impacted and distracted. Fragments are displaced anteriorly, posteriorly, medially and laterally. There is also a nondisplaced fracture of the tip of the lateral malleolus. The talus appears grossly intact. Calcaneus and midfoot are also intact. Soft tissues surrounding the ankle joint are severely swollen.  IMPRESSION: 1. Severely impacted and comminuted fracture of the distal tibia, with nondisplaced fracture of the tip of the lateral malleolus, as above.   Electronically Signed   By: Trudie Reed M.D.   On: 06/22/2013 05:31     EKG Interpretation None      Date: 06/22/2013  Rate: 81  Rhythm: normal sinus rhythm  QRS Axis: normal  Intervals: normal  ST/T Wave abnormalities: normal  Conduction Disutrbances:none  Narrative Interpretation:   Old EKG Reviewed: none available    MDM   Final diagnoses:  Fracture of fibula, distal, left, closed  Fracture of tibia, distal, left, closed    BP 163/92  Pulse 91  Temp(Src) 98.1 F (36.7 C) (Oral)  Resp 20  Ht 6' (1.829 m)  Wt 240 lb (108.863 kg)  BMI 32.54 kg/m2  SpO2 97%  I have reviewed nursing notes and vital signs. I personally reviewed the imaging tests through PACS system  I reviewed available ER/hospitalization records thought the EMR     Fayrene Helper, PA-C 06/22/13 0754  Fayrene Helper, PA-C 06/24/13 [phone]

## 2013-06-22 NOTE — H&P (Signed)
Subjective:   Patient is a 58 y.o. male presents with acute fall and left ankle pain and deformity. Patient complained of pain and inability to bear weight on the left.  Fall about 10 feet.  Patient Active Problem List   Diagnosis Date Noted  . Closed left ankle fracture 06/22/2013  . Closed pilon fracture of left tibia 06/22/2013   Past Medical History  Diagnosis Date  . Hypertension   . Sleep apnea     History reviewed. No pertinent past surgical history.   (Not in a hospital admission) Allergies  Allergen Reactions  . Contrast Media [Iodinated Diagnostic Agents] Rash    History  Substance Use Topics  . Smoking status: Never Smoker   . Smokeless tobacco: Not on file  . Alcohol Use: Yes    History reviewed. No pertinent family history.  Review of Systems Pertinent items are noted in HPI.  Objective:   Patient Vitals for the past 8 hrs:  BP Temp Temp src Pulse Resp SpO2 Height Weight  06/22/13 0744 163/92 mmHg - - 91 20 97 % - -  06/22/13 0546 143/68 mmHg - - 82 16 96 % - -  06/22/13 0500 143/87 mmHg 98.1 F (36.7 C) Oral 86 16 98 % 6' (1.829 m) 108.863 kg (240 lb)          exam:  AAO, no neck tenderness, normal ROM, no tenderness T and L spine  Bilateral UEs with normal ROM and strength, chest with normal excursion and no tenderness, abdomen soft and nontender.  Right LE with normal ROM,  Left ankle swollen and deformed, skin intact, no blisters, wiggles toes  ECG: pending.  Data Review  Assessment:   Active Problems:   Closed left ankle fracture   Closed pilon fracture of left tibia   Plan:   Closed reduction and splinting performed with hematoma block.  Plan EX Fix to regain length later today. Have discussed with Dr Toni ArthursJohn Hewitt, foot and ankle specialist.  Patient understands and agrees with the plan.

## 2013-06-22 NOTE — Progress Notes (Signed)
Orthopedic Tech Progress Note Patient Details:  Parker Bond Feb 07, 1956 [id number]  Ortho Devices Type of Ortho Device: Ace wrap;Stirrup splint;Post (short leg) splint Ortho Device/Splint Interventions: Application   Haskell Flirtewsome, Abbott Jasinski M 06/22/2013, 7:57 AM

## 2013-06-22 NOTE — Transfer of Care (Signed)
Immediate Anesthesia Transfer of Care Note  Patient: Parker Bond  Procedure(s) Performed: Procedure(s):  APPLICATION OF EXTERNAL FIXATION FOR LEFT PILON FRACTURE (Left)  Patient Location: PACU  Anesthesia Type:General  Level of Consciousness: awake, alert , oriented and patient cooperative  Airway & Oxygen Therapy: Patient Spontanous Breathing and Patient connected to nasal cannula oxygen  Post-op Assessment: Report given to PACU RN, Post -op Vital signs reviewed and stable and Patient moving all extremities  Post vital signs: Reviewed and stable  Complications: No apparent anesthesia complications

## 2013-06-22 NOTE — ED Notes (Signed)
Ortho tech called for short leg splint to left leg

## 2013-06-22 NOTE — Brief Op Note (Signed)
06/22/2013  1:50 PM  PATIENT:  Parker Bond  58 y.o. male  PRE-OPERATIVE DIAGNOSIS:  left PILON FRACTURE, comminuted and displaced  POST-OPERATIVE DIAGNOSIS:  left PILON FRACTURE, comminuted and displaced  PROCEDURE:  Procedure(s):  APPLICATION OF EXTERNAL FIXATION FOR LEFT PILON FRACTURE (Left), closed reduction  SURGEON:  Surgeon(s) and Role:    * Verlee RossettiSteven R Vittorio Mohs, MD - Primary  PHYSICIAN ASSISTANT:   ASSISTANTS: none   ANESTHESIA:   general  EBL:  Total I/O In: 700 [I.V.:700] Out: -   BLOOD ADMINISTERED:none  DRAINS: none   LOCAL MEDICATIONS USED:  NONE  SPECIMEN:  No Specimen  DISPOSITION OF SPECIMEN:  N/A  COUNTS:  YES  TOURNIQUET:  * No tourniquets in log *  DICTATION: .Other Dictation: Dictation Number [id number]  PLAN OF CARE: Admit to inpatient   PATIENT DISPOSITION:  PACU - hemodynamically stable.   Delay start of Pharmacological VTE agent (>24hrs) due to surgical blood loss or risk of bleeding: no

## 2013-06-22 NOTE — Progress Notes (Signed)
Utilization review completed.  

## 2013-06-23 ENCOUNTER — Encounter (HOSPITAL_COMMUNITY): Payer: Self-pay | Admitting: Orthopedic Surgery

## 2013-06-23 NOTE — Progress Notes (Signed)
Pt. Is already on his home CPAP via his FFM from home with 4L O2 bled in. Pt. Is tolerating CPAP well at this time without any complications.

## 2013-06-23 NOTE — Care Management Note (Signed)
CARE MANAGEMENT NOTE 06/23/2013  Patient:  Parker Bond,Parker Bond   Account Number:  [id number]  Date Initiated:  06/23/2013  Documentation initiated by:  Vance PeperBRADY,Nicklas Mcsweeney  Subjective/Objective Assessment:   58 yr old male admitted with a left closed pilon fracture, left distal tibia fracture.     Action/Plan:   Patient had closed reduction with palcement of external fixator, returning to OR today. CM will continue to monitor.   Anticipated DC Date:     Anticipated DC Plan:           Choice offered to / List presented to:             Status of service:   Medicare Important Message given?   (If response is "NO", the following Medicare IM given date fields will be blank) Date Medicare IM given:   Date Additional Medicare IM given:    Discharge Disposition:    Per UR Regulation:    If discussed at Long Length of Stay Meetings, dates discussed:    Comments:  06/23/13 10:00am  Vance PeperSusan Haneen Bernales, RN BSN Case Manager [phone] Patient is under worker's comp. Called Mindi JunkerMarsha Renigar [phone] and left a message. Will need to arrange for home health and DME.

## 2013-06-23 NOTE — Anesthesia Postprocedure Evaluation (Signed)
  Anesthesia Post-op Note  Patient: Parker Bond  Procedure(s) Performed: Procedure(s):  APPLICATION OF EXTERNAL FIXATION FOR LEFT PILON FRACTURE (Left)  Patient Location: PACU  Anesthesia Type:General  Level of Consciousness: awake and alert   Airway and Oxygen Therapy: Patient Spontanous Breathing  Post-op Pain: moderate  Post-op Assessment: Post-op Vital signs reviewed, Patient's Cardiovascular Status Stable and Respiratory Function Stable  Post-op Vital Signs: Reviewed  Filed Vitals:   06/23/13 0610  BP: 153/85  Pulse: 92  Temp: 36.8 C  Resp: 18    Complications: No apparent anesthesia complications

## 2013-06-23 NOTE — Op Note (Signed)
Parker Bond:  Parker Bond, Parker Bond               ACCOUNT NO.:  [id number]  MEDICAL RECORD NO.:  [id number]  LOCATION:  XRAY                         FACILITY:  [facility]  PHYSICIAN:  Almedia BallsSteven R. Ranell PatrickNorris, M.D. DATE OF BIRTH:  10/12/1955  DATE OF PROCEDURE:  06/22/2013 DATE OF DISCHARGE:                              OPERATIVE REPORT   PREOPERATIVE DIAGNOSIS:  Left comminuted displaced tibial Pilon fracture.  POSTOPERATIVE DIAGNOSES:  Left comminuted displaced tibial Pilon fracture as well as proximal fibular fracture.  ATTENDING SURGEON:  Almedia BallsSteven R. Ranell PatrickNorris, MD.  ASSISTANT:  None.  ANESTHESIA:  General anesthesia via endotracheal tube was used.  ESTIMATED BLOOD LOSS:  Minimal.  FLUID REPLACEMENT:  1000 mL crystalloid.  INSTRUMENT COUNTS:  Correct.  COMPLICATIONS:  There were no complications.  ANTIBIOTICS:  Perioperative antibiotics were given.  INDICATIONS:  The patient is a 58 year old male who fell on the job approximately 10 feet injuring his left ankle.  The patient presented with a severely comminuted and displaced Pilon fracture of his ankle with significant disruption of the ankle mortise.  The patient's ankle was significantly shortened by this injury.  There is gross deformity present, unfortunately this was closed, and neurovascular intact.  I counseled the patient regarding the need to close reduce this ankle fracture under general anesthesia and get him back at a length with an external fixator which be a temporizing measure to allow for CT evaluation of the Pilon fracture and eventual open reduction and internal fixation, I consulted Dr. Toni ArthursJohn Hewitt, foot and ankle specialist, regarding plan of care, and he is on board.  The patient agreed and family agreed, informed consent was obtained.  DESCRIPTION OF PROCEDURE:  After adequate level of anesthesia was achieved, the patient was positioned in supine on the radiolucent operating table.  After sterile prep and drape of the leg, a  time-out was called.  We then utilized C-arm arthroscopy in multiple planes to correctly place a centrally threaded pin in the calcaneus, was a transverse pin, and then we also placed two 6.5 Schanz pins in the tibia which were bicortical.  We then went ahead and applied a Biomet external fixture, this is our large external fixator in a delta frame format. The patient's ankle was reduced under longitudinal traction with multiplanar C-arm.  We were able to reestablish the length of the fracture which was severely comminuted, but the ankle mortise was in much better position both on the AP and lateral views.  We then tightened up the pin to pin clamps and the pin to bar clamps and obtained a satisfactory stabilization of the fracture in an adequate position.  The skin was actually in better condition.  Once we finished that, we irrigated the wounds.  We went ahead and wrapped the pins in Xeroform gauze and then 4-inch Kerlix, and then applied an Ace wrap around the entire foot and ankle.  The patient was transported to recovery room in stable condition having tolerated the procedure well.     Almedia BallsSteven R. Ranell PatrickNorris, M.D.     SRN/MEDQ  D:  06/22/2013  T:  06/23/2013  Job:  [id number]

## 2013-06-23 NOTE — Progress Notes (Signed)
Orthopedics Progress Note  Subjective: I am hurting, the foot hurts  Objective:  Filed Vitals:   06/23/13 0610  BP: 153/85  Pulse: 92  Temp: 98.3 F (36.8 C)  Resp: 18    General: Awake and alert  Musculoskeletal: left leg swollen but compartments not tense,  Able to actively move the toes and the foot with assistance Neurovascularly intact  Lab Results  Component Value Date   WBC 12.5* 06/22/2013   HGB 14.5 06/22/2013   HCT 41.6 06/22/2013   MCV 95.0 06/22/2013   PLT 200 06/22/2013       Component Value Date/Time   NA 139 06/22/2013 0812   K 4.2 06/22/2013 0812   CL 103 06/22/2013 0812   CO2 23 06/22/2013 0812   GLUCOSE 179* 06/22/2013 0812   BUN 18 06/22/2013 0812   CREATININE 0.77 06/22/2013 1618   CALCIUM 9.2 06/22/2013 0812   GFRNONAA >90 06/22/2013 1618   GFRAA >90 06/22/2013 1618    Lab Results  Component Value Date   INR 0.93 06/22/2013    Assessment/Plan: POD #1 s/p Procedure(s):  APPLICATION OF EXTERNAL FIXATION FOR LEFT PILON FRACTURE Patient remains too swollen for surgery.  Pain wise and to keep him elevated he will need to remain in house through the weekend . Re Diet  Almedia BallsSteven R. Ranell PatrickNorris, MD 06/23/2013 1:30 PM

## 2013-06-24 MED ORDER — BISACODYL 10 MG RE SUPP
10.0000 mg | Freq: Every day | RECTAL | Status: DC | PRN
  Administered 2013-06-26 – 2013-06-27 (×2): 10 mg via RECTAL
  Filled 2013-06-24 (×2): qty 1

## 2013-06-24 MED ORDER — ALUM & MAG HYDROXIDE-SIMETH 200-200-20 MG/5ML PO SUSP
30.0000 mL | Freq: Four times a day (QID) | ORAL | Status: DC | PRN
  Administered 2013-06-24 – 2013-06-28 (×2): 30 mL via ORAL
  Filled 2013-06-24 (×2): qty 30

## 2013-06-24 MED ORDER — SENNA 8.6 MG PO TABS
2.0000 | ORAL_TABLET | Freq: Two times a day (BID) | ORAL | Status: DC
  Administered 2013-06-24 – 2013-06-28 (×9): 17.2 mg via ORAL
  Filled 2013-06-24 (×10): qty 2

## 2013-06-24 MED ORDER — OXYCODONE HCL 5 MG PO TABS
5.0000 mg | ORAL_TABLET | ORAL | Status: DC | PRN
  Administered 2013-06-24 – 2013-06-26 (×7): 10 mg via ORAL
  Administered 2013-06-27 (×2): 5 mg via ORAL
  Administered 2013-06-27: 10 mg via ORAL
  Administered 2013-06-27: 5 mg via ORAL
  Administered 2013-06-27 – 2013-06-28 (×3): 10 mg via ORAL
  Filled 2013-06-24: qty 1
  Filled 2013-06-24 (×4): qty 2
  Filled 2013-06-24: qty 1
  Filled 2013-06-24 (×9): qty 2

## 2013-06-24 MED ORDER — DOCUSATE SODIUM 100 MG PO CAPS
100.0000 mg | ORAL_CAPSULE | Freq: Two times a day (BID) | ORAL | Status: DC
  Administered 2013-06-24 – 2013-06-28 (×9): 100 mg via ORAL
  Filled 2013-06-24 (×9): qty 1

## 2013-06-24 NOTE — Progress Notes (Signed)
Parker Bond  MRN: [id number] DOB/Age: [birth year] 58 y.o. Physician: Lynnea MaizesK Parker Bond, M.D. 2 Days Post-Op Procedure(s) (LRB):  APPLICATION OF EXTERNAL FIXATION FOR LEFT PILON FRACTURE (Left)  Subjective: Resting comfortably with pain meds. Vital Signs Temp:  [98.1 F (36.7 C)-98.4 F (36.9 C)] 98.4 F (36.9 C) (03/28 0522) Pulse Rate:  [81-96] 96 (03/28 0522) Resp:  [18] 18 (03/28 0522) BP: (98-139)/(61-83) 98/64 mmHg (03/28 1032) SpO2:  [94 %-100 %] 96 % (03/28 0522)  Lab Results  Recent Labs  06/22/13 0812 06/22/13 1618  WBC 10.8* 12.5*  HGB 14.7 14.5  HCT 40.6 41.6  PLT 199 200   BMET  Recent Labs  06/22/13 0812 06/22/13 1618  NA 139  --   K 4.2  --   CL 103  --   CO2 23  --   GLUCOSE 179*  --   BUN 18  --   CREATININE 0.79 0.77  CALCIUM 9.2  --    INR  Date Value Ref Range Status  06/22/2013 0.93  0.00 - 1.49 Final     Exam  Proximal pin site dressings changed, no active bleeding. EHL 3/5, FHL3/5. Distal pin dressings dry.  Plan Continue elevation, edema reduction. PT to teach [email or web address]  Keviana Guida M 06/24/2013, 11:01 AM

## 2013-06-24 NOTE — Progress Notes (Signed)
Subjective: 2 Days Post-Op Procedure(s) (LRB):  APPLICATION OF EXTERNAL FIXATION FOR LEFT PILON FRACTURE (Left) Patient reports pain as severe at times.  Generally well controlled.  No n/v/f/c.  No BM.  Passing some gas.  Objective: Vital signs in last 24 hours: Temp:  [98.1 F (36.7 C)-98.4 F (36.9 C)] 98.4 F (36.9 C) (03/28 0522) Pulse Rate:  [81-96] 96 (03/28 0522) Resp:  [18] 18 (03/28 0522) BP: (98-139)/(61-83) 98/64 mmHg (03/28 1032) SpO2:  [94 %-100 %] 96 % (03/28 0522)  Intake/Output from previous day: 03/27 0701 - 03/28 0700 In: 360 [P.O.:360] Out: 825 [Urine:825] Intake/Output this shift: Total I/O In: 240 [P.O.:240] Out: -    Recent Labs  06/22/13 0812 06/22/13 1618  HGB 14.7 14.5    Recent Labs  06/22/13 0812 06/22/13 1618  WBC 10.8* 12.5*  RBC 4.34 4.38  HCT 40.6 41.6  PLT 199 200    Recent Labs  06/22/13 0812 06/22/13 1618  NA 139  --   K 4.2  --   CL 103  --   CO2 23  --   BUN 18  --   CREATININE 0.79 0.77  GLUCOSE 179*  --   CALCIUM 9.2  --     Recent Labs  06/22/13 0812  INR 0.93    PE:  wn wd male in nad.  L LE with ex fix in place.  Pin tracts appear clean and healthy.  Assessment/Plan: 2 Days Post-Op Procedure(s) (LRB):  APPLICATION OF EXTERNAL FIXATION FOR LEFT PILON FRACTURE (Left) Up with therapy  nwb on L LE.  Elevate for pain control and to treat swelling.  Pt continues to require inpatient stay due to severe pain requiring IV narcotic pain medicine.  Parker Bond, Parker Bond 06/24/2013, 12:33 PM

## 2013-06-24 NOTE — Evaluation (Signed)
Physical Therapy Evaluation Patient Details Name: Parker Bond MRN: [id number] DOB: 03-07-1956 Today's Date: 06/24/2013   History of Present Illness  admitted after fall from ~10 feet sustaining comminuted L tibial/pylon fx, s/p external fixation.  Clinical Impression  Pt admitted after fall with L pylon comminuted fx, s/p ext. fixation.  Pt currently limited functionally due to the problems listed below.  (see problems list.)  Pt will benefit from PT to maximize function and safety to be able to get home safely with available assist of wife and family.     Follow Up Recommendations Home health PT;Supervision for mobility/OOB    Equipment Recommendations  Rolling walker with 5" wheels (?tub or shower seat)    Recommendations for Other Services       Precautions / Restrictions Restrictions Weight Bearing Restrictions: Yes LLE Weight Bearing: Non weight bearing      Mobility  Bed Mobility Overal bed mobility: Needs Assistance Bed Mobility: Supine to Sit;Sit to Supine     Supine to sit: Min assist;+2 for safety/equipment Sit to supine: Min assist;+2 for safety/equipment   General bed mobility comments: assist L LE only  Transfers Overall transfer level: Needs assistance Equipment used: Rolling walker (2 wheeled) Transfers: Sit to/from Stand Sit to Stand: Mod assist;+2 safety/equipment         General transfer comment: lifting and stability assist  Ambulation/Gait                Stairs            Wheelchair Mobility    Modified Rankin (Stroke Patients Only)       Balance Overall balance assessment: Needs assistance Sitting-balance support: No upper extremity supported;Feet supported Sitting balance-Leahy Scale: Fair     Standing balance support: Bilateral upper extremity supported;During functional activity Standing balance-Leahy Scale: Poor Standing balance comment: used RW--stood well, but assisted due to situation and dizzy.                     Pertinent Vitals/Pain Pain not rated, but pain meds asked for and given    Home Living Family/patient expects to be discharged to:: Private residence Living Arrangements: Spouse/significant other Available Help at Discharge: Family;Available 24 hours/day Type of Home: House Home Access: Ramped entrance     Home Layout: One level Home Equipment: Wheelchair - power;Wheelchair - manual;Bedside commode      Prior Function                 Hand Dominance        Extremity/Trunk Assessment   Upper Extremity Assessment: Overall WFL for tasks assessed           Lower Extremity Assessment: Overall WFL for tasks assessed;LLE deficits/detail   LLE Deficits / Details: pt able to lift leg against gravity     Communication      Cognition Arousal/Alertness: Awake/alert Behavior During Therapy: WFL for tasks assessed/performed Overall Cognitive Status: Within Functional Limits for tasks assessed                      General Comments General comments (skin integrity, edema, etc.): pt reports dizziness and spinning with movement that quickly starts resolving when still.  Consider BPPV    Exercises        Assessment/Plan    PT Assessment Patient needs continued PT services  PT Diagnosis Difficulty walking;Acute pain   PT Problem List Decreased activity tolerance;Decreased balance;Decreased mobility;Decreased knowledge of precautions;Pain;Decreased strength  PT Treatment  Interventions DME instruction;Gait training;Functional mobility training;Therapeutic activities;Therapeutic exercise;Patient/family education   PT Goals (Current goals can be found in the Care Plan section) Acute Rehab PT Goals Patient Stated Goal: home,  eventually back to work PT Goal Formulation: With patient Time For Goal Achievement: 07/08/13 Potential to Achieve Goals: Good    Frequency Min 5X/week   Barriers to discharge        End of Session   Activity  Tolerance: Patient tolerated treatment well (limited by nausea) Patient left: in bed;with call bell/phone within reach;with family/visitor present         Time: [id number] PT Time Calculation (min): 35 min   Charges:   PT Evaluation $Initial PT Evaluation Tier I: 1 Procedure PT Treatments $Therapeutic Activity: 23-37 mins   PT G Codes:          Hailley Byers, Eliseo GumKenneth V 06/24/2013, 4:55 PM  06/24/2013  Edmonds BingKen Jeena Arnett, PT [phone]  (pager)

## 2013-06-25 ENCOUNTER — Inpatient Hospital Stay (HOSPITAL_COMMUNITY): Payer: Worker's Compensation

## 2013-06-25 MED ORDER — HYDROCODONE-ACETAMINOPHEN 5-325 MG PO TABS
1.0000 | ORAL_TABLET | Freq: Four times a day (QID) | ORAL | Status: DC | PRN
  Administered 2013-06-25: 2 via ORAL
  Administered 2013-06-25: 1 via ORAL
  Administered 2013-06-26: 2 via ORAL
  Filled 2013-06-25 (×4): qty 2

## 2013-06-25 NOTE — ED Provider Notes (Signed)
Medical screening examination/treatment/procedure(s) were performed by non-physician practitioner and as supervising physician I was immediately available for consultation/collaboration.   EKG Interpretation   Date/Time:  Thursday June 22 2013 08:38:52 EDT Ventricular Rate:  81 PR Interval:  157 QRS Duration: 96 QT Interval:  401 QTC Calculation: 465 R Axis:   37 Text Interpretation:  Sinus rhythm ED PHYSICIAN INTERPRETATION AVAILABLE  IN [facility] Confirmed by TEST, Record ([id number]) on 06/24/2013 1:43:26  PM        Brandt LoosenJulie Manly, MD 06/25/13 [phone]

## 2013-06-25 NOTE — Progress Notes (Signed)
Subjective: 3 Days Post-Op Procedure(s) (LRB):  APPLICATION OF EXTERNAL FIXATION FOR LEFT PILON FRACTURE (Left) Patient reports pain as 3 on 0-10 scale.  Has not had PT and is not ready for DC today. Needs reevaluated in A.M.  Objective: Vital signs in last 24 hours: Temp:  [97.6 F (36.4 C)-98.5 F (36.9 C)] 98.5 F (36.9 C) (03/29 0655) Pulse Rate:  [95-98] 98 (03/29 0655) Resp:  [18] 18 (03/29 0655) BP: (98-158)/(55-84) 158/84 mmHg (03/29 0655) SpO2:  [94 %-96 %] 95 % (03/29 0655)  Intake/Output from previous day: 03/28 0701 - 03/29 0700 In: 1790 [P.O.:1440; IV Piggyback:350] Out: 1450 [Urine:1450] Intake/Output this shift:     Recent Labs  06/22/13 1618  HGB 14.5    Recent Labs  06/22/13 1618  WBC 12.5*  RBC 4.38  HCT 41.6  PLT 200    Recent Labs  06/22/13 1618  CREATININE 0.77   No results found for this basename: LABPT, INR,  in the last 72 hours  No major issues except for above.  Assessment/Plan: 3 Days Post-Op Procedure(s) (LRB):  APPLICATION OF EXTERNAL FIXATION FOR LEFT PILON FRACTURE (Left) Up with therapy  Xena Propst A 06/25/2013, 8:16 AM

## 2013-06-25 NOTE — Progress Notes (Signed)
Physical Therapy Treatment Patient Details Name: Parker Bond MRN: [id number] DOB: 05/11/1955 Today's Date: 06/25/2013    History of Present Illness admitted after fall from ~10 feet sustaining comminuted L tibial/pylon fx, s/p external fixation.    PT Comments    Pt seen for second PT visit this pm.  Pt with fair pain control to 6/10 at rest at left ankle, however when attempting to sit at EOB pt experienced excruciating pain and could not tolerate.  Returned to supine and positioned comfortable with HOB at 45 degrees. Minimal ability to wiggle toes or active quad. Severe pain limiting mobility, pt unsafe to d/c home at this time.  Left shoulder assessed following (-) xray noting pain and weakness with forward flexion.  Recommend ice modality and consider further imaging if no improvement noted.  PT continuing to follow acute.  Follow Up Recommendations        Equipment Recommendations       Recommendations for Other Services       Precautions / Restrictions Restrictions LLE Weight Bearing: Non weight bearing Other Position/Activity Restrictions: elevate left leg    Mobility  Bed Mobility Overal bed mobility: Needs Assistance Bed Mobility: Supine to Sit;Sit to Supine     Supine to sit: Max assist;+2 for physical assistance;HOB elevated Sit to supine: Max assist;+2 for physical assistance;HOB elevated   General bed mobility comments: much more limited today, requiring L LE management and trunk assist to attempt to sit at EOB; unable to fully come to sitting due to EXCRUTIATING pain in ankle, assisted back to supine and repostioned with foot again elevated, ice pack to foot (x20 mintues) and HOB returned to 45 degrees.  Transfers Overall transfer level:  (unable today)                  Ambulation/Gait                 Stairs            Wheelchair Mobility    Modified Rankin (Stroke Patients Only)       Balance                                     Cognition Arousal/Alertness: Awake/alert Behavior During Therapy: WFL for tasks assessed/performed Overall Cognitive Status: Within Functional Limits for tasks assessed       Memory: Decreased recall of precautions (suspect due to medication)              Exercises      General Comments General comments (skin integrity, edema, etc.): anterior/superior most pin with mild bleeding during therapy.  Stops immediately.  Noted Left shoulder xray (-) for fracture.  Pt described MOI as compression during fall.  Assessed shoulder ROM noting pain and weakness with forward flexion > abduction and postive empty can test suggesting supraspinatus injury.  Provided ice to shoulder (x20 minutes) and reassess in am.        Pertinent Vitals/Pain 6/10 rest, 10/10 attempting to sit up.  BP assessed semi-recumbent pre/post stable at 120s/60-70s    Home Living                      Prior Function            PT Goals (current goals can now be found in the care plan section) Acute Rehab PT Goals Patient Stated Goal: home,  eventually  back to work Progress towards PT goals: Not progressing toward goals - comment (too much pain today)    Frequency  Min 5X/week    PT Plan Current plan remains appropriate    End of Session   Activity Tolerance: Patient limited by pain (excrutiating pain in ankle upon dependent positioning) Patient left: in bed;with call bell/phone within reach;with family/visitor present     Time: 1332-1406 PT Time Calculation (min): 34 min  Charges:  $Therapeutic Activity: 23-37 mins                    G Codes:      Parker Bond, Parker Bond 06/25/2013, 2:14 PM

## 2013-06-25 NOTE — Progress Notes (Signed)
Patient was unable to raise left shoulder this AM above heart. Received order for xray of shoulder. Patient was not able to tolerate PT today due to pain. Will continue to monitor pain level and adjust accordingly.

## 2013-06-26 ENCOUNTER — Inpatient Hospital Stay (HOSPITAL_COMMUNITY): Payer: Worker's Compensation

## 2013-06-26 MED ORDER — LORAZEPAM 2 MG/ML IJ SOLN
0.5000 mg | Freq: Once | INTRAMUSCULAR | Status: AC
  Administered 2013-06-27: 0.5 mg via INTRAVENOUS
  Filled 2013-06-26: qty 1

## 2013-06-26 MED ORDER — CELECOXIB 200 MG PO CAPS
200.0000 mg | ORAL_CAPSULE | Freq: Every day | ORAL | Status: DC
  Administered 2013-06-26 – 2013-06-28 (×3): 200 mg via ORAL
  Filled 2013-06-26 (×4): qty 1

## 2013-06-26 MED ORDER — FLEET ENEMA 7-19 GM/118ML RE ENEM: 1.0000 | ENEMA | Freq: Every day | RECTAL | Status: DC | PRN

## 2013-06-26 MED ORDER — MAGNESIUM CITRATE PO SOLN
0.5000 | Freq: Once | ORAL | Status: AC
  Administered 2013-06-27: 0.5 via ORAL
  Filled 2013-06-26: qty 296

## 2013-06-26 NOTE — Progress Notes (Signed)
   Subjective: 4 Days Post-Op Procedure(s) (LRB):  APPLICATION OF EXTERNAL FIXATION FOR LEFT PILON FRACTURE (Left)  Moderate pain to left ankle and left shoulder today Dr. Victorino DikeHewitt evaluated pt earlier today and no plan for surgery until swelling improves which may take 2 weeks Plan for discharge home once pain under adequate control with oral meds Patient reports pain as moderate.  Objective:   VITALS:   Filed Vitals:   06/26/13 1237  BP: 143/76  Pulse: 96  Temp: 98.3 F (36.8 C)  Resp: 18    Left lower extremity in ex fix Moderate edema to pretibial area and left foot/ankle No rashes  Left shoulder with moderate weakness with ER and forward flexion nv intact distally  LABS No results found for this basename: HGB, HCT, WBC, PLT,  in the last 72 hours  No results found for this basename: NA, K, BUN, CREATININE, GLUCOSE,  in the last 72 hours   Assessment/Plan: 4 Days Post-Op Procedure(s) (LRB):  APPLICATION OF EXTERNAL FIXATION FOR LEFT PILON FRACTURE (Left) Continue with pain management Will plan for d/c once pain under control with oral meds Will evaluate left shoulder with MRI to assess for suspected rotator cuff tear      Alphonsa OverallBrad Manreet Kiernan, MPAS, PA-C  06/26/2013, 12:49 PM

## 2013-06-26 NOTE — Progress Notes (Signed)
Subjective: 4 Days Post-Op Procedure(s) (LRB):  APPLICATION OF EXTERNAL FIXATION FOR LEFT PILON FRACTURE (Left) Patient reports pain as severe.  Pt has not really tolerated being up on the walker yet due to severe throbbing pain in the ankle.  No BM yet.   Objective: Vital signs in last 24 hours: Temp:  [98.2 F (36.8 C)-99.1 F (37.3 C)] 98.2 F (36.8 C) (03/30 0523) Pulse Rate:  [83-95] 95 (03/30 0523) Resp:  [16-18] 18 (03/30 0748) BP: (116-148)/(56-76) 148/76 mmHg (03/30 0523) SpO2:  [96 %-98 %] 98 % (03/30 0523)  Intake/Output from previous day: 03/29 0701 - 03/30 0700 In: 1240 [P.O.:1190; IV Piggyback:50] Out: 2650 [Urine:2650] Intake/Output this shift:    No results found for this basename: HGB,  in the last 72 hours No results found for this basename: WBC, RBC, HCT, PLT,  in the last 72 hours No results found for this basename: NA, K, CL, CO2, BUN, CREATININE, GLUCOSE, CALCIUM,  in the last 72 hours No results found for this basename: LABPT, INR,  in the last 72 hours  PE:  pin tracts look healthy.  PRAFO in place.  Skin still tight and won't wrinkle.  Assessment/Plan: 4 Days Post-Op Procedure(s) (LRB):  APPLICATION OF EXTERNAL FIXATION FOR LEFT PILON FRACTURE (Left) Up with therapy  Emphasized the importance of a BM today.  I'll add an enema and mag citrate to the orders.   Toni ArthursHEWITT, Parker Bond 06/26/2013, 7:49 AM

## 2013-06-26 NOTE — Progress Notes (Signed)
Pt has home CPAP unit in room that he administers himself.

## 2013-06-26 NOTE — Progress Notes (Signed)
Physical Therapy Treatment Patient Details Name: Parker Bond MRN: [id number] DOB: 07-31-1955 Today's Date: 06/26/2013    History of Present Illness admitted after fall from ~10 feet sustaining comminuted L tibial/pylon fx, s/p external fixation.    PT Comments    Pt very anxious and nervous about transferring to chair today. C/o Lt UE pain and has limited strength and ROM; awaiting MRI results so encouraged NWB on Lt UE. Pt able to transfer to drop arm chair with 2 person (A) today. Educated family on D/C plan and DME needs. WIll cont to follow per POC; sliding board is in room for nursing to transfer pt back to bed.   Follow Up Recommendations  Home health PT;Supervision for mobility/OOB     Equipment Recommendations  3in1 (PT);Other (comment);Wheelchair (measurements PT);Wheelchair cushion (measurements PT) (drop arm 3 in 1; tub bench; sliding board )    Recommendations for Other Services       Precautions / Restrictions Precautions Precautions: Fall Precaution Comments: pt limited ROM in Lt arm; awaiting MRI; encouraged to maintain NWB status on Lt UE till results  Restrictions Weight Bearing Restrictions: Yes LLE Weight Bearing: Non weight bearing Other Position/Activity Restrictions: encouraged minimal use and NWB through Lt UE till MRI results are in     Mobility  Bed Mobility Overal bed mobility: Needs Assistance Bed Mobility: Supine to Sit     Supine to sit: Mod assist;+2 for physical assistance;HOB elevated     General bed mobility comments: one person (A) with advancing and maintaining Lt LE while second person uses draw pad to (A) with waist advancement and trunk elevation; requires incr time and limited greatly by pain   Transfers Overall transfer level: Needs assistance   Transfers: Lateral/Scoot Transfers          Lateral/Scoot Transfers: From elevated surface;+2 safety/equipment;Min assist General transfer comment: second person helpful for safety;  cues for sequencing; pt demo good trunk control with transfer; (A) to maintain NWB through Lt LE and keep elevated during transfer; brought in sliding board and demo proper technique; encouraged nursing to use sliding board to transfer back to bed  Ambulation/Gait             General Gait Details: unable to ambulate at this time    Stairs            Wheelchair Mobility    Modified Rankin (Stroke Patients Only)       Balance Overall balance assessment: Needs assistance Sitting-balance support: Feet supported;No upper extremity supported Sitting balance-Leahy Scale: Fair Sitting balance - Comments: tolerated sitting EOB ~10 min prior to transfer; demo fair dynamic sitting balance with transfer                             Cognition Arousal/Alertness: Awake/alert Behavior During Therapy: Anxious;Impulsive Overall Cognitive Status: Within Functional Limits for tasks assessed                      Exercises      General Comments General comments (skin integrity, edema, etc.): discussed at length plan of care, goals and DME needed upon acute D/C; family and pt appreciative       Pertinent Vitals/Pain 10/10; patient repositioned for comfort; Lt LE elevated with pillow.    Home Living  family has built ramp for wheelchair access to house  Prior Function            PT Goals (current goals can now be found in the care plan section) Acute Rehab PT Goals Patient Stated Goal: home,  eventually back to work PT Goal Formulation: With patient Time For Goal Achievement: 07/08/13 Potential to Achieve Goals: Good Progress towards PT goals: Progressing toward goals    Frequency  Min 5X/week    PT Plan Current plan remains appropriate    End of Session Equipment Utilized During Treatment: Other (comment) (sliding board ) Activity Tolerance: Patient limited by pain Patient left: in chair;with call bell/phone within  reach;with family/visitor present     Time: 1610-9604 PT Time Calculation (min): 31 min  Charges:  $Therapeutic Activity: 23-37 mins                    G CodesDonell Bond, [place] [phone] 06/26/2013, 3:38 PM

## 2013-06-27 ENCOUNTER — Inpatient Hospital Stay (HOSPITAL_COMMUNITY): Payer: Worker's Compensation

## 2013-06-27 DIAGNOSIS — F329 Major depressive disorder, single episode, unspecified: Secondary | ICD-10-CM

## 2013-06-27 DIAGNOSIS — F32A Depression, unspecified: Secondary | ICD-10-CM | POA: Diagnosis present

## 2013-06-27 DIAGNOSIS — F3289 Other specified depressive episodes: Secondary | ICD-10-CM

## 2013-06-27 DIAGNOSIS — F411 Generalized anxiety disorder: Secondary | ICD-10-CM | POA: Diagnosis present

## 2013-06-27 DIAGNOSIS — G4700 Insomnia, unspecified: Secondary | ICD-10-CM | POA: Diagnosis present

## 2013-06-27 MED ORDER — GABAPENTIN 300 MG PO CAPS
300.0000 mg | ORAL_CAPSULE | Freq: Every day | ORAL | Status: DC
  Administered 2013-06-27: 300 mg via ORAL
  Filled 2013-06-27 (×2): qty 1

## 2013-06-27 MED ORDER — CITALOPRAM HYDROBROMIDE 20 MG PO TABS
20.0000 mg | ORAL_TABLET | Freq: Every day | ORAL | Status: DC
Start: 2013-06-28 — End: 2013-06-28
  Administered 2013-06-28: 20 mg via ORAL
  Filled 2013-06-27: qty 1

## 2013-06-27 MED ORDER — LORAZEPAM 2 MG/ML IJ SOLN
0.5000 mg | Freq: Once | INTRAMUSCULAR | Status: AC
  Administered 2013-06-27: 0.5 mg via INTRAVENOUS
  Filled 2013-06-27: qty 1

## 2013-06-27 MED ORDER — ENSURE PUDDING PO PUDG: 1.0000 | Freq: Three times a day (TID) | ORAL | Status: DC

## 2013-06-27 MED ORDER — GABAPENTIN 600 MG PO TABS
300.0000 mg | ORAL_TABLET | Freq: Every day | ORAL | Status: DC
  Filled 2013-06-27: qty 0.5

## 2013-06-27 MED ORDER — CEPHALEXIN 500 MG PO CAPS
500.0000 mg | ORAL_CAPSULE | Freq: Four times a day (QID) | ORAL | Status: DC
  Administered 2013-06-27 – 2013-06-28 (×6): 500 mg via ORAL
  Filled 2013-06-27 (×9): qty 1

## 2013-06-27 NOTE — Care Management Note (Signed)
06/27/13 1600 Parker PeperSusan Kenslie Abbruzzese, RN BSN Case Manager [phone] CM has left multiple messages for the worker's comp adjuster Enid Skeensicole Godfrey [phone]. Will continue to try to contact her.

## 2013-06-27 NOTE — Progress Notes (Signed)
Orthopedics Progress Note  Subjective: I  Would like to go home but know I am not able right now.  Objective:  Filed Vitals:   06/27/13 0644  BP: 145/76  Pulse: 92  Temp: 98.2 F (36.8 C)  Resp: 18    General: Awake and alert  Musculoskeletal: Left shoulder is weak with limited abduction, left ankle pin sites look good - peroxide cleaned Right heel less tender and nonswollen Neurovascularly intact  Lab Results  Component Value Date   WBC 12.5* 06/22/2013   HGB 14.5 06/22/2013   HCT 41.6 06/22/2013   MCV 95.0 06/22/2013   PLT 200 06/22/2013       Component Value Date/Time   NA 139 06/22/2013 0812   K 4.2 06/22/2013 0812   CL 103 06/22/2013 0812   CO2 23 06/22/2013 0812   GLUCOSE 179* 06/22/2013 0812   BUN 18 06/22/2013 0812   CREATININE 0.77 06/22/2013 1618   CALCIUM 9.2 06/22/2013 0812   GFRNONAA >90 06/22/2013 1618   GFRAA >90 06/22/2013 1618    Lab Results  Component Value Date   INR 0.93 06/22/2013    Assessment/Plan: POD #4 s/p Procedure(s):  APPLICATION OF EXTERNAL FIXATION FOR LEFT PILON FRACTURE Parker Bond remains in a lot of pain and is dealing with depression and anxiety due to the pain and the injury. There will need to be a lot of arrangements made at home prior to d/c.  MRI pending for the shoulder. Right heel neg for fracture based upon XRAYs. Possible D/C tomorrow. D/C planning with PT and OT recommendations.   Almedia BallsSteven R. Ranell PatrickNorris, MD 06/27/2013 7:54 AM

## 2013-06-27 NOTE — Progress Notes (Signed)
PT Cancellation Note  Patient Details Name: Parker Bond MRN: [id number] DOB: 1956-01-22   Cancelled Treatment:     Pt just returned from trying to get a MRI. Pt was unable to get procedure done this am and a second attempt will be made today. Pt reported he is getting a suppository and does not want to do therapy now.   Greggory StallionWrisley, Brooke Steinhilber Kerstine 06/27/2013, 11:19 AM

## 2013-06-27 NOTE — Consult Note (Addendum)
[facility] Medical Consultation  Parker Bond QMV:[id number] DOB: 03/04/1956 DOA: 06/22/2013 PCP: Lester Roscoe, MD   Requesting physician: Ranell Patrick Date of consultation: 06/27/13 Reason for consultation: depression, anxiety  Impression/Recommendations   Depression: has had depression for over 15 years.  Has been on relatively low dose citalopram for a few days. Will increase to 20 mg daily. Will help with anxiety as well.   Anxiety state, unspecified   Insomnia: wife to provide home med info.   Closed left ankle fracture   Closed pilon fracture of left tibia  Thanks for consult.  I will follow periodically while inpatient.  Chief Complaint: depression  HPI:  Patient is a 58 year old white male who sustained an ankle injury and has been admitted since 3/26. He has an external fixator on his left ankle and require another surgery in the future. He has a history of depression and anxiety for over 15 years treated by his primary care provider. He takes 10 mg of citalopram daily. He had been doing fairly well until recently. He feels hopeless, feelings of guilt and worthlessness. Difficulty falling asleep and staying asleep. Bouts of tearfulness and anxiety, "mind racing".  Hospitalists have been consulted for assistance with depression and anxiety. He takes 1-2 of an unknown sleeping pill nightly, but has not had it since admission.  Review of Systems:  Systems reviewed. As above otherwise negative.  Past Medical History  Diagnosis Date  . Hypertension   . H/O hiatal hernia   . High cholesterol   . GERD (gastroesophageal reflux disease)   . OSA on CPAP   . Hemochromatosis   . Anxiety   . Depression    Past Surgical History  Procedure Laterality Date  . Tibia fracture surgery Left 06/22/2013    APPLICATION OF EXTERNAL FIXATION FOR LEFT PILON FRACTURE; closed reduction  . Tonsillectomy    . Inguinal hernia repair Bilateral   . External fixation leg Left 06/22/2013   Procedure:  APPLICATION OF EXTERNAL FIXATION FOR LEFT PILON FRACTURE;  Surgeon: Verlee Rossetti, MD;  Location: [facility] OR;  Service: Orthopedics;  Laterality: Left;   Social History:  reports that he has quit smoking. His smoking use included Cigarettes. He has a 15 pack-year smoking history. He has never used smokeless tobacco. He reports that he drinks alcohol. He reports that he does not use illicit drugs.  Allergies  Allergen Reactions  . Contrast Media [Iodinated Diagnostic Agents] Rash   History reviewed. No pertinent family history.  Prior to Admission medications   Medication Sig Start Date End Date Taking? Authorizing Provider  acetaminophen (TYLENOL) 500 MG tablet Take 1,000-2,000 mg by mouth 2 (two) times daily as needed for moderate pain.   Yes Historical Provider, MD  citalopram (CELEXA) 10 MG tablet Take 10 mg by mouth daily.   Yes Historical Provider, MD  ibuprofen (ADVIL,MOTRIN) 200 MG tablet Take 800 mg by mouth daily as needed for mild pain.   Yes Historical Provider, MD  lisinopril-hydrochlorothiazide (PRINZIDE,ZESTORETIC) 20-25 MG per tablet Take 1 tablet by mouth daily.   Yes Historical Provider, MD  omeprazole (PRILOSEC) 40 MG capsule Take 40 mg by mouth daily. 06/11/13  Yes Historical Provider, MD  pravastatin (PRAVACHOL) 20 MG tablet Take 20 mg by mouth daily.  06/04/13  Yes Historical Provider, MD  PRESCRIPTION MEDICATION Take 1-2 capsules by mouth at bedtime.   Yes Historical Provider, MD   Physical Exam: Blood pressure 135/80, pulse 91, temperature 98 F (36.7 C), temperature source Oral, resp. rate 20,  height 6' (1.829 m), weight 108.863 kg (240 lb), SpO2 100.00%. Filed Vitals:   06/27/13 1211  BP: 135/80  Pulse: 91  Temp: 98 F (36.7 C)  Resp: 20   BP 135/80  Pulse 91  Temp(Src) 98 F (36.7 C) (Oral)  Resp 20  Ht 6' (1.829 m)  Wt 108.863 kg (240 lb)  BMI 32.54 kg/m2  SpO2 100%  General Appearance:    Alert, cooperative  Head:    Normocephalic, without  obvious abnormality, atraumatic  Eyes:    PERRL, conjunctiva/corneas clear, EOM's intact, fundi    benign, both eyes       Ears:    Normal TM's and external ear canals, both ears  Nose:   Nares normal, septum midline, mucosa normal, no drainage   or sinus tenderness  Throat:   Lips, mucosa, and tongue normal; teeth and gums normal  Neck:   Supple, symmetrical, trachea midline, no adenopathy;       thyroid:  No enlargement/tenderness/nodules; no carotid   bruit or JVD     Lungs:     Clear to auscultation bilaterally, respirations unlabored  Chest wall:    No tenderness or deformity  Heart:    Regular rate and rhythm, S1 and S2 normal, no murmur, rub   or gallop  Abdomen:     Soft, non-tender, bowel sounds active all four quadrants,    no masses, no organomegaly  Genitalia:   deferred  Rectal:   deferred  Extremities:   Left ankle with external fixator        Lymph nodes:   Cervical, supraclavicular, and axillary nodes normal  Neurologic:   CNII-XII intact. Normal strength, sensation and reflexes      throughout    Psych: Poor eye contact. Affect sad. Occasional tearfulness. Pleasant and cooperative.   Labs on Admission:  Basic Metabolic Panel:  Recent Labs Lab 06/22/13 0812 06/22/13 1618  NA 139  --   K 4.2  --   CL 103  --   CO2 23  --   GLUCOSE 179*  --   BUN 18  --   CREATININE 0.79 0.77  CALCIUM 9.2  --    Liver Function Tests: No results found for this basename: AST, ALT, ALKPHOS, BILITOT, PROT, ALBUMIN,  in the last 168 hours No results found for this basename: LIPASE, AMYLASE,  in the last 168 hours No results found for this basename: AMMONIA,  in the last 168 hours CBC:  Recent Labs Lab 06/22/13 0812 06/22/13 1618  WBC 10.8* 12.5*  HGB 14.7 14.5  HCT 40.6 41.6  MCV 93.5 95.0  PLT 199 200   Cardiac Enzymes: No results found for this basename: CKTOTAL, CKMB, CKMBINDEX, TROPONINI,  in the last 168 hours BNP: No components found with this basename:  POCBNP,  CBG: No results found for this basename: GLUCAP,  in the last 168 hours  Radiological Exams on Admission: Dg Eye Foreign Body  06/27/2013   CLINICAL DATA:  Metal working/exposure; clearance prior to MRI  EXAM: ORBITS FOR FOREIGN BODY - 2 VIEW  COMPARISON:  None.  FINDINGS: Water's views with eyes deviated toward the left and toward the right were obtained. There is no intraorbital radiopaque foreign body. No fracture or dislocation. There is mucosal thickening in the inferior right maxillary antrum.  IMPRESSION: No evidence of metallic foreign body within the orbits. Right maxillary sinus disease inferiorly.   Electronically Signed   By: Bretta Bang M.D.   On: 06/27/2013  10:11   Dg Os Calcis Right  06/26/2013   CLINICAL DATA:  Fall with pilon fracture of left ankle.  EXAM: RIGHT OS CALCIS - 2+ VIEW  COMPARISON:  None.  FINDINGS: The right calcaneus appears intact without evidence of fracture. Subtalar joint show normal alignment. No soft tissue abnormalities are seen.  IMPRESSION: No evidence of right calcaneal fracture.   Electronically Signed   By: Irish LackGlenn  Yamagata M.D.   On: 06/26/2013 15:29   Dg Foot Complete Right  06/26/2013   CLINICAL DATA:  Fall with pilon fracture of left ankle.  EXAM: RIGHT FOOT COMPLETE - 3+ VIEW  COMPARISON:  None.  FINDINGS: No acute fracture or dislocation is seen involving the right foot. There is no evidence of calcaneal fracture. Soft tissues appear unremarkable.  IMPRESSION: No acute fracture identified involving the right foot.   Electronically Signed   By: Irish LackGlenn  Yamagata M.D.   On: 06/26/2013 15:28    Christiane HaSULLIVAN,Jolina Symonds L [facility] Pager [phone]  If 7PM-7AM, please contact night-coverage [email or web address] Password [email or web address] 06/27/2013, 3:35 PM

## 2013-06-28 ENCOUNTER — Other Ambulatory Visit: Payer: Self-pay

## 2013-06-28 DIAGNOSIS — S82899A Other fracture of unspecified lower leg, initial encounter for closed fracture: Secondary | ICD-10-CM

## 2013-06-28 HISTORY — PX: ROTATOR CUFF REPAIR: SHX139

## 2013-06-28 MED ORDER — CEPHALEXIN 500 MG PO CAPS: 500.0000 mg | ORAL_CAPSULE | Freq: Four times a day (QID) | ORAL | Status: DC

## 2013-06-28 MED ORDER — METHOCARBAMOL 500 MG PO TABS: 500.0000 mg | ORAL_TABLET | Freq: Four times a day (QID) | ORAL | Status: DC | PRN

## 2013-06-28 MED ORDER — OXYCODONE HCL 5 MG PO TABS: 5.0000 mg | ORAL_TABLET | ORAL | Status: DC | PRN

## 2013-06-28 MED ORDER — OXYCODONE HCL 5 MG PO TABS
5.0000 mg | ORAL_TABLET | ORAL | Status: DC | PRN
  Administered 2013-06-28 (×3): 10 mg via ORAL
  Filled 2013-06-28 (×3): qty 2

## 2013-06-28 NOTE — Progress Notes (Signed)
   Subjective: 6 Days Post-Op Procedure(s) (LRB):  APPLICATION OF EXTERNAL FIXATION FOR LEFT PILON FRACTURE (Left)  Pt with mild pain today but overall doing better from pain standpoint MRI of left shoulder shows rotator cuff tear Plan for d/c today and f/u outpatient for ankle and shoulder Patient reports pain as mild.  Objective:   VITALS:   Filed Vitals:   06/28/13 0638  BP: 145/80  Pulse: 91  Temp: 97.6 F (36.4 C)  Resp: 18    Left lower extremity with ex fix in place Pins look appropriate nv intact distally Edema is slowly improving   LABS No results found for this basename: HGB, HCT, WBC, PLT,  in the last 72 hours  No results found for this basename: NA, K, BUN, CREATININE, GLUCOSE,  in the last 72 hours   Assessment/Plan: 6 Days Post-Op Procedure(s) (LRB):  APPLICATION OF EXTERNAL FIXATION FOR LEFT PILON FRACTURE (Left) D/c home today with home equipment F/u next week with Dr. Victorino DikeHewitt Pt and wife in agreement     Alphonsa OverallBrad Dixon, MPAS, PA-C  06/28/2013, 8:24 AM

## 2013-06-28 NOTE — Progress Notes (Signed)
Physical Therapy Treatment Patient Details Name: Parker Bond MRN: [id number] DOB: 1955-06-03 Today's Date: 06/28/2013    History of Present Illness admitted after fall from ~10 feet sustaining comminuted L tibial/pylon fx, s/p external fixation.    PT Comments    Pt progressing well with transfers, was able to scoot out of bed to chair and then uphill from chair to bed with min A only at LLE to maintain WB'ing. Pt practiced standing with min A and tolerated this but continues to become dizzy with standing. PT will continue to follow.   Follow Up Recommendations  Home health PT;Supervision for mobility/OOB     Equipment Recommendations  3in1 (PT);Wheelchair (measurements PT);Wheelchair cushion (measurements PT);Rolling walker with 5" wheels;Other (comment) (tub seat)    Recommendations for Other Services       Precautions / Restrictions Precautions Precautions: Fall Precaution Comments: MRI showed RTC tear left, released to bear wt through LUE, to be addressed later Restrictions Weight Bearing Restrictions: Yes LLE Weight Bearing: Non weight bearing    Mobility  Bed Mobility Overal bed mobility: Needs Assistance Bed Mobility: Sit to Supine       Sit to supine: Min assist   General bed mobility comments: min A to support left LE while pt scooted self to head of bed and assumed supine  Transfers Overall transfer level: Needs assistance Equipment used: Rolling walker (2 wheeled);None Transfers: Sit to/from Stand;Lateral/Scoot Transfers Sit to Stand: +2 safety/equipment;Min assist        Lateral/Scoot Transfers: Min assist General transfer comment: pt performed lateral scoot transfer uphill from chair to bed with only one person assist at left foot to keep off floor. Pt doing well with scooting now that he can use the LUE. Also practiced sit to stand to progress towards ambulation. Was able to achieve standing to RW with min A at LLE to keep NWB. Standing up fully  increased LLE pain, standing only maintained 20 sec before sitting back down. Educated pt that when he can tolerate more standing, that he will need a shoe on R foot, especially since he has bruise to right heel  Ambulation/Gait             General Gait Details: unable   Stairs            Wheelchair Mobility    Modified Rankin (Stroke Patients Only)       Balance Overall balance assessment: Needs assistance Sitting-balance support: No upper extremity supported;Feet unsupported Sitting balance-Leahy Scale: Good     Standing balance support: Bilateral upper extremity supported Standing balance-Leahy Scale: Poor Standing balance comment: assist at LLE and UE support needed. Pt began to feel slightly dizzy                    Cognition Arousal/Alertness: Awake/alert Behavior During Therapy: Anxious Overall Cognitive Status: Within Functional Limits for tasks assessed                      Exercises      General Comments        Pertinent Vitals/Pain VSS    Home Living                      Prior Function            PT Goals (current goals can now be found in the care plan section) Acute Rehab PT Goals Patient Stated Goal: home,  eventually back to work PT Goal  Formulation: With patient Time For Goal Achievement: 07/08/13 Potential to Achieve Goals: Good Progress towards PT goals: Progressing toward goals    Frequency  Min 5X/week    PT Plan Current plan remains appropriate    Co-evaluation             End of Session Equipment Utilized During Treatment: Gait belt Activity Tolerance: Patient tolerated treatment well Patient left: in bed;with call bell/phone within reach;with family/visitor present     Time: [id number] PT Time Calculation (min): 21 min  Charges:  $Therapeutic Activity: 8-22 mins                    G Codes:     Lyanne CoVictoria Sheena Simonis, PT  Acute Rehab Services  [phone]  Lyanne CoManess, Lanyah Spengler 06/28/2013,  3:11 PM

## 2013-06-28 NOTE — Discharge Summary (Signed)
Physician Discharge Summary   Patient ID: Parker Bond MRN: [id number] DOB/AGE: 29-Dec-1955 58 y.o.  Admit date: 06/22/2013 Discharge date: 06/28/2013  Admission Diagnoses:  Active Problems:   Closed left ankle fracture   Closed pilon fracture of left tibia   Depression   Anxiety state, unspecified   Insomnia   Discharge Diagnoses:  Same   Surgeries: Procedure(s):  APPLICATION OF EXTERNAL FIXATION FOR LEFT PILON FRACTURE on 06/22/2013   Consultants: PT/OT  Discharged Condition: Stable  Hospital Course: Kit Mollett is an 58 y.o. male who was admitted 06/22/2013 with a chief complaint of  Chief Complaint  Patient presents with  . Ankle Pain  , and found to have a diagnosis of <principal problem not specified>.  They were brought to the operating room on 06/22/2013 and underwent the above named procedures.    The patient had an uncomplicated hospital course and was stable for discharge.  Recent vital signs:  Filed Vitals:   06/28/13 0638  BP: 145/80  Pulse: 91  Temp: 97.6 F (36.4 C)  Resp: 18    Recent laboratory studies:  Results for orders placed during the hospital encounter of 06/22/13  PROTIME-INR      Result Value Ref Range   Prothrombin Time 12.3  11.6 - 15.2 seconds   INR 0.93  0.00 - 1.49  APTT      Result Value Ref Range   aPTT 28  24 - 37 seconds  CBC      Result Value Ref Range   WBC 10.8 (*) 4.0 - 10.5 K/uL   RBC 4.34  4.22 - 5.81 MIL/uL   Hemoglobin 14.7  13.0 - 17.0 g/dL   HCT 40.9  81.1 - 91.4 %   MCV 93.5  78.0 - 100.0 fL   MCH 33.9  26.0 - 34.0 pg   MCHC 36.2 (*) 30.0 - 36.0 g/dL   RDW 78.2  95.6 - 21.3 %   Platelets 199  150 - 400 K/uL  BASIC METABOLIC PANEL      Result Value Ref Range   Sodium 139  137 - 147 mEq/L   Potassium 4.2  3.7 - 5.3 mEq/L   Chloride 103  96 - 112 mEq/L   CO2 23  19 - 32 mEq/L   Glucose, Bld 179 (*) 70 - 99 mg/dL   BUN 18  6 - 23 mg/dL   Creatinine, Ser 0.86  0.50 - 1.35 mg/dL   Calcium 9.2  8.4 - 57.8  mg/dL   GFR calc non Af Amer >90  >90 mL/min   GFR calc Af Amer >90  >90 mL/min  URINALYSIS, ROUTINE W REFLEX MICROSCOPIC      Result Value Ref Range   Color, Urine YELLOW  YELLOW   APPearance CLEAR  CLEAR   Specific Gravity, Urine 1.029  1.005 - 1.030   pH 5.0  5.0 - 8.0   Glucose, UA NEGATIVE  NEGATIVE mg/dL   Hgb urine dipstick NEGATIVE  NEGATIVE   Bilirubin Urine NEGATIVE  NEGATIVE   Ketones, ur NEGATIVE  NEGATIVE mg/dL   Protein, ur NEGATIVE  NEGATIVE mg/dL   Urobilinogen, UA 0.2  0.0 - 1.0 mg/dL   Nitrite NEGATIVE  NEGATIVE   Leukocytes, UA NEGATIVE  NEGATIVE  CBC      Result Value Ref Range   WBC 12.5 (*) 4.0 - 10.5 K/uL   RBC 4.38  4.22 - 5.81 MIL/uL   Hemoglobin 14.5  13.0 - 17.0 g/dL   HCT 41.6  39.0 - 52.0 %   MCV 95.0  78.0 - 100.0 fL   MCH 33.1  26.0 - 34.0 pg   MCHC 34.9  30.0 - 36.0 g/dL   RDW 16.1  09.6 - 04.5 %   Platelets 200  150 - 400 K/uL  CREATININE, SERUM      Result Value Ref Range   Creatinine, Ser 0.77  0.50 - 1.35 mg/dL   GFR calc non Af Amer >90  >90 mL/min   GFR calc Af Amer >90  >90 mL/min  TYPE AND SCREEN      Result Value Ref Range   ABO/RH(D) A POS     Antibody Screen NEG     Sample Expiration 06/25/2013    ABO/RH      Result Value Ref Range   ABO/RH(D) A POS      Discharge Medications:     Medication List    ASK your doctor about these medications       acetaminophen 500 MG tablet  Commonly known as:  TYLENOL  Take 1,000-2,000 mg by mouth 2 (two) times daily as needed for moderate pain.     CELEXA 10 MG tablet  Generic drug:  citalopram  Take 10 mg by mouth daily.     ibuprofen 200 MG tablet  Commonly known as:  ADVIL,MOTRIN  Take 800 mg by mouth daily as needed for mild pain.     lisinopril-hydrochlorothiazide 20-25 MG per tablet  Commonly known as:  PRINZIDE,ZESTORETIC  Take 1 tablet by mouth daily.     omeprazole 40 MG capsule  Commonly known as:  PRILOSEC  Take 40 mg by mouth daily.     pravastatin 20 MG tablet   Commonly known as:  PRAVACHOL  Take 20 mg by mouth daily.     PRESCRIPTION MEDICATION  Take 1-2 capsules by mouth at bedtime.        Diagnostic Studies: Dg Eye Foreign Body  06/27/2013   CLINICAL DATA:  Metal working/exposure; clearance prior to MRI  EXAM: ORBITS FOR FOREIGN BODY - 2 VIEW  COMPARISON:  None.  FINDINGS: Water's views with eyes deviated toward the left and toward the right were obtained. There is no intraorbital radiopaque foreign body. No fracture or dislocation. There is mucosal thickening in the inferior right maxillary antrum.  IMPRESSION: No evidence of metallic foreign body within the orbits. Right maxillary sinus disease inferiorly.   Electronically Signed   By: Bretta Bang M.D.   On: 06/27/2013 10:11   Dg Ankle Complete Left  06/22/2013   CLINICAL DATA:  History of fall complaining of ankle pain.  EXAM: LEFT ANKLE COMPLETE - 3+ VIEW  COMPARISON:  No priors.  FINDINGS: Severely comminuted fracture of the distal tibia with multiple fracture fragments which appear impacted and distracted. Fragments are displaced anteriorly, posteriorly, medially and laterally. There is also a nondisplaced fracture of the tip of the lateral malleolus. The talus appears grossly intact. Calcaneus and midfoot are also intact. Soft tissues surrounding the ankle joint are severely swollen.  IMPRESSION: 1. Severely impacted and comminuted fracture of the distal tibia, with nondisplaced fracture of the tip of the lateral malleolus, as above.   Electronically Signed   By: Trudie Reed M.D.   On: 06/22/2013 05:31   Dg Os Calcis Right  06/26/2013   CLINICAL DATA:  Fall with pilon fracture of left ankle.  EXAM: RIGHT OS CALCIS - 2+ VIEW  COMPARISON:  None.  FINDINGS: The right calcaneus appears intact without  evidence of fracture. Subtalar joint show normal alignment. No soft tissue abnormalities are seen.  IMPRESSION: No evidence of right calcaneal fracture.   Electronically Signed   By: Irish Lack M.D.   On: 06/26/2013 15:29   Ct Ankle Left Wo Contrast  06/23/2013   CLINICAL DATA:  Left ankle fracture in external fixation. Preoperative planning.  EXAM: CT OF THE LEFT ANKLE WITHOUT CONTRAST; 3-DIMENSIONAL CT IMAGE RENDERING ON ACQUISITION WORKSTATION  TECHNIQUE: Multidetector CT imaging was performed according to the standard protocol. Multiplanar CT image reconstructions were also generated.; 3-dimensional CT images were rendered by post-processing of the original CT data on an acquisition workstation. The 3-dimensional CT images were interpreted and findings were reported in the accompanying complete CT report for this study  COMPARISON:  DG ANKLE 2 VIEWS *L* dated 06/22/2013; DG ANKLE COMPLETE*L* dated 06/22/2013  FINDINGS: The left lower leg is in external fixation. The distal fixator is within the calcaneus. The proximal tibial fixators are not imaged. Again demonstrated is an extensively comminuted intra-articular fracture of the distal tibia. There is diffuse irregularity of the weight-bearing articular surface of the tibial plafond with up to 7 mm of depression. There is a major fragment involving the posterior malleolus which is displaced by 1.4 cm along the posterior aspect of the metaphysis. Another fragment undermining the medial malleolus is mildly displaced and rotated.  There is irregular spurring of the distal fibula with adjacent ossicles which are well corticated. There are probable small acute avulsion fractures from the fibular tip. However, the remainder of the fibula appears intact.  There is no significant residual subluxation of talus. No acute talar dome injury is identified. There are multiple intra-articular fracture fragments. The subtalar joint, calcaneus and other visualized tarsal bones appear intact. There is no widening of the distal tibia fibular joint, although the tibial fracture extends into it anteriorly.  Soft tissue windows demonstrate diffuse soft tissue edema  surrounding the lower leg and ankle. There is lateral displacement of the tibialis anterior and flexor digitorum longus tendons into the tibial fracture, just proximal to the medial malleolus. The tendons appear intact. No entrapment or disruption of the peroneal tendons is identified. The Achilles tendon demonstrates non insertional fusiform enlargement, likely representing tendinosis.  IMPRESSION: 1. Extensively comminuted intra-articular fracture of the distal tibia as described. There are multiple associated intra-articular fracture fragments. 2. Minor avulsion fractures of the fibular tip. 3. No evidence of talar dome injury. 4. Lateral displacement and probable entrapment of the tibialis anterior and flexor digitorum longus tendons into the tibial fracture. 5. Non insertional Achilles tendinosis.   Electronically Signed   By: Roxy Horseman M.D.   On: 06/23/2013 09:15   Ct 3d Recon At Scanner  06/23/2013   CLINICAL DATA:  Left ankle fracture in external fixation. Preoperative planning.  EXAM: CT OF THE LEFT ANKLE WITHOUT CONTRAST; 3-DIMENSIONAL CT IMAGE RENDERING ON ACQUISITION WORKSTATION  TECHNIQUE: Multidetector CT imaging was performed according to the standard protocol. Multiplanar CT image reconstructions were also generated.; 3-dimensional CT images were rendered by post-processing of the original CT data on an acquisition workstation. The 3-dimensional CT images were interpreted and findings were reported in the accompanying complete CT report for this study  COMPARISON:  DG ANKLE 2 VIEWS *L* dated 06/22/2013; DG ANKLE COMPLETE*L* dated 06/22/2013  FINDINGS: The left lower leg is in external fixation. The distal fixator is within the calcaneus. The proximal tibial fixators are not imaged. Again demonstrated is an extensively comminuted intra-articular fracture of  the distal tibia. There is diffuse irregularity of the weight-bearing articular surface of the tibial plafond with up to 7 mm of depression.  There is a major fragment involving the posterior malleolus which is displaced by 1.4 cm along the posterior aspect of the metaphysis. Another fragment undermining the medial malleolus is mildly displaced and rotated.  There is irregular spurring of the distal fibula with adjacent ossicles which are well corticated. There are probable small acute avulsion fractures from the fibular tip. However, the remainder of the fibula appears intact.  There is no significant residual subluxation of talus. No acute talar dome injury is identified. There are multiple intra-articular fracture fragments. The subtalar joint, calcaneus and other visualized tarsal bones appear intact. There is no widening of the distal tibia fibular joint, although the tibial fracture extends into it anteriorly.  Soft tissue windows demonstrate diffuse soft tissue edema surrounding the lower leg and ankle. There is lateral displacement of the tibialis anterior and flexor digitorum longus tendons into the tibial fracture, just proximal to the medial malleolus. The tendons appear intact. No entrapment or disruption of the peroneal tendons is identified. The Achilles tendon demonstrates non insertional fusiform enlargement, likely representing tendinosis.  IMPRESSION: 1. Extensively comminuted intra-articular fracture of the distal tibia as described. There are multiple associated intra-articular fracture fragments. 2. Minor avulsion fractures of the fibular tip. 3. No evidence of talar dome injury. 4. Lateral displacement and probable entrapment of the tibialis anterior and flexor digitorum longus tendons into the tibial fracture. 5. Non insertional Achilles tendinosis.   Electronically Signed   By: Roxy HorsemanBill  Veazey M.D.   On: 06/23/2013 09:15   Dg Chest Portable 1 View  06/22/2013   CLINICAL DATA:  Preoperative exam prior to ankle surgery  EXAM: PORTABLE CHEST - 1 VIEW  COMPARISON:  None.  FINDINGS: The lungs are adequately inflated and clear. The  cardiac silhouette is normal in size. The pulmonary vascularity is not engorged. The mediastinum is normal in width. There is no pleural effusion.  IMPRESSION: There is no evidence of active cardiopulmonary disease.   Electronically Signed   By: David  SwazilandJordan   On: 06/22/2013 08:16   Dg Shoulder Left  06/25/2013   CLINICAL DATA:  58 year old male status post fall on the left shoulder with pain. Initial encounter.  EXAM: LEFT SHOULDER - 2+ VIEW  COMPARISON:  None.  FINDINGS: Bone mineralization is within normal limits. Proximal left humerus appears intact. No glenohumeral joint dislocation. Visible left clavicle intact. No scapula fracture identified. Grossly intact visible left ribs.  IMPRESSION: No acute fracture or dislocation identified about the left shoulder.   Electronically Signed   By: Augusto GambleLee  Hall M.D.   On: 06/25/2013 11:35   Dg Foot Complete Right  06/26/2013   CLINICAL DATA:  Fall with pilon fracture of left ankle.  EXAM: RIGHT FOOT COMPLETE - 3+ VIEW  COMPARISON:  None.  FINDINGS: No acute fracture or dislocation is seen involving the right foot. There is no evidence of calcaneal fracture. Soft tissues appear unremarkable.  IMPRESSION: No acute fracture identified involving the right foot.   Electronically Signed   By: Irish LackGlenn  Yamagata M.D.   On: 06/26/2013 15:28   Dg C-arm 1-60 Min  06/22/2013   CLINICAL DATA:  External fixation device placement for the left ankle  EXAM: DG C-ARM 1-60 MIN  FLUOROSCOPY TIME:  Twenty-two second  COMPARISON:  DG ANKLE COMPLETE*L* dated 06/22/2013  FINDINGS: Three intraoperative fluoroscopic spot images are provided. There are 2  screws from an external fixation device within the mid tibial diaphysis. There are 2 screws in the left calcaneus from an external fixation device.  There is severely comminuted fracture of the knee distal tibial metaphysis extending to and disrupting the tibial plafond.  IMPRESSION: External fixation device placement to transfix a distal left  tibial fracture.   Electronically Signed   By: Elige Ko   On: 06/22/2013 14:12    Disposition: Final discharge disposition not confirmed       Signed: Chinenye Katzenberger B 06/28/2013, 8:27 AM

## 2013-06-28 NOTE — Care Management Note (Signed)
CARE MANAGEMENT NOTE 06/28/2013  Patient:  Parker Bond,Gerrell   Account Number:  [id number]  Date Initiated:  06/23/2013  Documentation initiated by:  Vance PeperBRADY,Charliee Krenz  Subjective/Objective Assessment:   58 yr old male admitted with a left closed pilon fracture, left distal tibia fracture.     Action/Plan:   Patient had closed reduction with palcement of external fixator, returning to OR today. CM will continue to monitor.   Anticipated DC Date:  06/28/2013   Anticipated DC Plan:  HOME/SELF CARE      DC Planning Services  CM consult      PAC Choice  DURABLE MEDICAL EQUIPMENT   Choice offered to / List presented to:  NA   DME arranged  WHEELCHAIR - MANUAL  3-N-1  OTHER - SEE COMMENT      DME agency  [facility]        Status of service:  Completed, signed off Medicare Important Message given?   (If response is "NO", the following Medicare IM given date fields will be blank) Date Medicare IM given:   Date Additional Medicare IM given:    Discharge Disposition:  HOME/SELF CARE  Per UR Regulation:    If discussed at Long Length of Stay Meetings, dates discussed:    Comments:  06/28/13 10:00 - 1500 Vance PeperSusan Cammeron Greis, RN BSN Case Manager CM contacted Enid Skeensicole Godfrey @ [phone] concerning DME needs for discharge. Faxed orders to her [phone]. Recalled Joni Reiningicole @ 3p to see if vendor has been selected. Provided contact number for [facility]. 1600 Patient has received wheelchair with elevating legrests, droparm 3in1 and a slide board. Will discharge home with family.

## 2013-06-28 NOTE — Progress Notes (Signed)
ED CM [facility] D[facility]. [facility]  Which,  has not been delivered  yet.  Made nurse aware that DME may be running late. Attempted to contact [facility] reached the answering service after business hours. Attempted to contact patient at number provided in record to follow up on DME delivery, LVM to contact ED CM [phone] concerning the delivery of equipment this evening. Georga HackingCary will follow up with Unit CM in am. No further ED CM needs identified.

## 2013-08-28 HISTORY — PX: REPAIR / RESECT / TRANSPLANT BICEPS TENDON: SUR1150

## 2013-11-24 ENCOUNTER — Encounter (HOSPITAL_COMMUNITY): Payer: Self-pay | Admitting: Pharmacy Technician

## 2013-11-27 ENCOUNTER — Encounter (HOSPITAL_COMMUNITY)
Admission: RE | Admit: 2013-11-27 | Discharge: 2013-11-27 | Disposition: A | Discharge status: Home / Self Care | Payer: Worker's Compensation | Source: Ambulatory Visit | Attending: Orthopedic Surgery | Admitting: Orthopedic Surgery

## 2013-11-27 ENCOUNTER — Ambulatory Visit (HOSPITAL_COMMUNITY)
Admission: RE | Admit: 2013-11-27 | Discharge: 2013-11-27 | Disposition: A | Discharge status: Home / Self Care | Payer: Worker's Compensation | Source: Ambulatory Visit | Attending: Anesthesiology | Admitting: Anesthesiology

## 2013-11-27 ENCOUNTER — Encounter (HOSPITAL_COMMUNITY): Payer: Self-pay

## 2013-11-27 DIAGNOSIS — Z9889 Other specified postprocedural states: Secondary | ICD-10-CM | POA: Diagnosis not present

## 2013-11-27 DIAGNOSIS — I10 Essential (primary) hypertension: Secondary | ICD-10-CM | POA: Diagnosis not present

## 2013-11-27 HISTORY — DX: Shortness of breath: R06.02

## 2013-11-27 LAB — BASIC METABOLIC PANEL
Anion gap: 13 (ref 5–15)
BUN: 9 mg/dL (ref 6–23)
CHLORIDE: 98 meq/L (ref 96–112)
CO2: 24 mEq/L (ref 19–32)
Calcium: 9.5 mg/dL (ref 8.4–10.5)
Creatinine, Ser: 0.75 mg/dL (ref 0.50–1.35)
GFR calc non Af Amer: >90 mL/min (ref >90)
GLUCOSE: 93 mg/dL (ref 70–99)
POTASSIUM: 3.5 meq/L — AB (ref 3.7–5.3)
Sodium: 135 mEq/L — ABNORMAL LOW (ref 137–147)

## 2013-11-27 LAB — CBC
HCT: 41 % (ref 39.0–52.0)
HEMOGLOBIN: 14 g/dL (ref 13.0–17.0)
MCH: 29.5 pg (ref 26.0–34.0)
MCHC: 34.1 g/dL (ref 30.0–36.0)
MCV: 86.5 fL (ref 78.0–100.0)
Platelets: 280 10*3/uL (ref 150–400)
RBC: 4.74 MIL/uL (ref 4.22–5.81)
RDW: 13.2 % (ref 11.5–15.5)
WBC: 4.7 10*3/uL (ref 4.0–10.5)

## 2013-11-27 NOTE — Pre-Procedure Instructions (Signed)
Parker Bond  11/27/2013   Your procedure is scheduled on:  Thursday, September 3.  Report to [facility] Admitting at 10:15 AM.  Call this number if you have problems the morning of surgery: [phone]   Remember:   Do not eat food or drink liquids after midnight Wednesday, September 2.   Take these medicines the morning of surgery with A SIP OF WATER: citalopram (CELEXA), gabapentin (NEURONTIN), omeprazole (PRILOSEC).  May take Tylenol if needed.   Do not wear jewelry, make-up or nail polish.  Do not wear lotions, powders, or perfumes. M              Men may shave face and neck.  Do not bring valuables to the hospital. [facility] is not responsible for any belongings or valuables.               Contacts, dentures or bridgework may not be worn into surgery.  Leave suitcase in the car. After surgery it may be brought to your room.  For patients admitted to the hospital, discharge time is determined by your  treatment team.               Patients discharged the day of surgery will not be allowed to drive home.  Name and phone number of your driver:    Special Instructions: Review  [facility] - Preparing For Surgery.   Please read over the following fact sheets that you were given: Pain Booklet, Coughing and Deep Breathing and Surgical Site Infection Prevention

## 2013-11-29 ENCOUNTER — Other Ambulatory Visit: Payer: Self-pay | Admitting: Physician Assistant

## 2013-11-29 MED ORDER — CHLORHEXIDINE GLUCONATE 4 % EX LIQD
60.0000 mL | Freq: Once | CUTANEOUS | Status: DC
  Filled 2013-11-29: qty 60

## 2013-11-29 MED ORDER — SODIUM CHLORIDE 0.9 % IV SOLN: INTRAVENOUS | Status: DC

## 2013-11-29 MED ORDER — ACETAMINOPHEN 500 MG PO TABS
1000.0000 mg | ORAL_TABLET | Freq: Once | ORAL | Status: AC
  Administered 2013-11-30: 1000 mg via ORAL
  Filled 2013-11-29: qty 2

## 2013-11-29 MED ORDER — CEFAZOLIN SODIUM-DEXTROSE 2-3 GM-% IV SOLR
2.0000 g | INTRAVENOUS | Status: AC
  Administered 2013-11-30: 2 g via INTRAVENOUS
  Filled 2013-11-29: qty 50

## 2013-11-30 ENCOUNTER — Inpatient Hospital Stay (HOSPITAL_COMMUNITY): Payer: Worker's Compensation | Admitting: Anesthesiology

## 2013-11-30 ENCOUNTER — Encounter (HOSPITAL_COMMUNITY): Payer: Worker's Compensation | Admitting: Anesthesiology

## 2013-11-30 ENCOUNTER — Encounter (HOSPITAL_COMMUNITY): Payer: Self-pay | Admitting: *Deleted

## 2013-11-30 ENCOUNTER — Inpatient Hospital Stay (HOSPITAL_COMMUNITY)
Admission: RE | Admit: 2013-11-30 | Discharge: 2013-12-06 | DRG: 902 | Disposition: A | Discharge status: Home / Self Care | Payer: Worker's Compensation | Source: Ambulatory Visit | Attending: Orthopedic Surgery | Admitting: Orthopedic Surgery

## 2013-11-30 ENCOUNTER — Encounter (HOSPITAL_COMMUNITY)
Admission: RE | Disposition: A | Discharge status: Home / Self Care | Payer: Self-pay | Source: Ambulatory Visit | Attending: Orthopedic Surgery

## 2013-11-30 DIAGNOSIS — Y849 Medical procedure, unspecified as the cause of abnormal reaction of the patient, or of later complication, without mention of misadventure at the time of the procedure: Secondary | ICD-10-CM | POA: Diagnosis present

## 2013-11-30 DIAGNOSIS — F411 Generalized anxiety disorder: Secondary | ICD-10-CM | POA: Diagnosis present

## 2013-11-30 DIAGNOSIS — T8189XA Other complications of procedures, not elsewhere classified, initial encounter: Secondary | ICD-10-CM | POA: Diagnosis present

## 2013-11-30 DIAGNOSIS — Z7982 Long term (current) use of aspirin: Secondary | ICD-10-CM | POA: Diagnosis not present

## 2013-11-30 DIAGNOSIS — R7309 Other abnormal glucose: Secondary | ICD-10-CM | POA: Diagnosis present

## 2013-11-30 DIAGNOSIS — T8131XA Disruption of external operation (surgical) wound, not elsewhere classified, initial encounter: Secondary | ICD-10-CM | POA: Diagnosis present

## 2013-11-30 DIAGNOSIS — F329 Major depressive disorder, single episode, unspecified: Secondary | ICD-10-CM | POA: Diagnosis present

## 2013-11-30 DIAGNOSIS — F3289 Other specified depressive episodes: Secondary | ICD-10-CM | POA: Diagnosis present

## 2013-11-30 DIAGNOSIS — K219 Gastro-esophageal reflux disease without esophagitis: Secondary | ICD-10-CM | POA: Diagnosis present

## 2013-11-30 DIAGNOSIS — L299 Pruritus, unspecified: Secondary | ICD-10-CM | POA: Diagnosis not present

## 2013-11-30 DIAGNOSIS — M86179 Other acute osteomyelitis, unspecified ankle and foot: Secondary | ICD-10-CM

## 2013-11-30 DIAGNOSIS — I10 Essential (primary) hypertension: Secondary | ICD-10-CM | POA: Diagnosis present

## 2013-11-30 DIAGNOSIS — G4733 Obstructive sleep apnea (adult) (pediatric): Secondary | ICD-10-CM | POA: Diagnosis present

## 2013-11-30 DIAGNOSIS — Z792 Long term (current) use of antibiotics: Secondary | ICD-10-CM | POA: Diagnosis not present

## 2013-11-30 DIAGNOSIS — E7800 Pure hypercholesterolemia, unspecified: Secondary | ICD-10-CM | POA: Diagnosis present

## 2013-11-30 DIAGNOSIS — Z91041 Radiographic dye allergy status: Secondary | ICD-10-CM

## 2013-11-30 DIAGNOSIS — M86172 Other acute osteomyelitis, left ankle and foot: Secondary | ICD-10-CM

## 2013-11-30 DIAGNOSIS — M869 Osteomyelitis, unspecified: Secondary | ICD-10-CM | POA: Diagnosis present

## 2013-11-30 DIAGNOSIS — Z87891 Personal history of nicotine dependence: Secondary | ICD-10-CM

## 2013-11-30 DIAGNOSIS — T40605A Adverse effect of unspecified narcotics, initial encounter: Secondary | ICD-10-CM | POA: Diagnosis not present

## 2013-11-30 HISTORY — PX: HARDWARE REMOVAL: SHX979

## 2013-11-30 HISTORY — PX: I & D EXTREMITY: SHX5045

## 2013-11-30 LAB — SEDIMENTATION RATE: SED RATE: 7 mm/h (ref 0–16)

## 2013-11-30 SURGERY — REMOVAL, HARDWARE
Anesthesia: General | Site: Ankle | Laterality: Left

## 2013-11-30 MED ORDER — LACTATED RINGERS IV SOLN
INTRAVENOUS | Status: DC | PRN
  Administered 2013-11-30 (×2): via INTRAVENOUS

## 2013-11-30 MED ORDER — DOCUSATE SODIUM 100 MG PO CAPS
100.0000 mg | ORAL_CAPSULE | Freq: Two times a day (BID) | ORAL | Status: DC
  Administered 2013-11-30 – 2013-12-05 (×11): 100 mg via ORAL
  Filled 2013-11-30 (×13): qty 1

## 2013-11-30 MED ORDER — SODIUM CHLORIDE 0.9 % IV SOLN
INTRAVENOUS | Status: DC
  Administered 2013-11-30 – 2013-12-01 (×2): via INTRAVENOUS

## 2013-11-30 MED ORDER — DEXMEDETOMIDINE HCL IN NACL 200 MCG/50ML IV SOLN
INTRAVENOUS | Status: AC
  Filled 2013-11-30: qty 50

## 2013-11-30 MED ORDER — PHENYLEPHRINE HCL 10 MG/ML IJ SOLN
INTRAMUSCULAR | Status: DC | PRN
  Administered 2013-11-30 (×2): 80 ug via INTRAVENOUS

## 2013-11-30 MED ORDER — KETOROLAC TROMETHAMINE 30 MG/ML IJ SOLN
30.0000 mg | INTRAMUSCULAR | Status: AC
  Administered 2013-11-30: 20 mg via INTRAVENOUS
  Filled 2013-11-30 (×2): qty 1

## 2013-11-30 MED ORDER — KETAMINE HCL 100 MG/ML IJ SOLN
INTRAMUSCULAR | Status: AC
  Filled 2013-11-30: qty 1

## 2013-11-30 MED ORDER — SENNA 8.6 MG PO TABS
1.0000 | ORAL_TABLET | Freq: Two times a day (BID) | ORAL | Status: DC
  Administered 2013-11-30 – 2013-12-05 (×11): 8.6 mg via ORAL
  Filled 2013-11-30 (×13): qty 1

## 2013-11-30 MED ORDER — VANCOMYCIN HCL 1000 MG IV SOLR
INTRAVENOUS | Status: AC
  Filled 2013-11-30: qty 1000

## 2013-11-30 MED ORDER — FENTANYL CITRATE 0.05 MG/ML IJ SOLN
INTRAMUSCULAR | Status: DC | PRN
  Administered 2013-11-30: 50 ug via INTRAVENOUS
  Administered 2013-11-30 (×2): 100 ug via INTRAVENOUS

## 2013-11-30 MED ORDER — ONDANSETRON HCL 4 MG/2ML IJ SOLN
INTRAMUSCULAR | Status: DC | PRN
  Administered 2013-11-30: 4 mg via INTRAVENOUS

## 2013-11-30 MED ORDER — LISINOPRIL-HYDROCHLOROTHIAZIDE 20-25 MG PO TABS: 1.0000 | ORAL_TABLET | Freq: Every day | ORAL | Status: DC

## 2013-11-30 MED ORDER — ENOXAPARIN SODIUM 40 MG/0.4ML ~~LOC~~ SOLN
40.0000 mg | SUBCUTANEOUS | Status: DC
  Administered 2013-12-01 – 2013-12-05 (×5): 40 mg via SUBCUTANEOUS
  Filled 2013-11-30 (×7): qty 0.4

## 2013-11-30 MED ORDER — VANCOMYCIN HCL 500 MG IV SOLR
INTRAVENOUS | Status: AC
  Filled 2013-11-30: qty 500

## 2013-11-30 MED ORDER — SIMVASTATIN 20 MG PO TABS
20.0000 mg | ORAL_TABLET | Freq: Every day | ORAL | Status: DC
  Administered 2013-11-30 – 2013-12-05 (×6): 20 mg via ORAL
  Filled 2013-11-30 (×7): qty 1

## 2013-11-30 MED ORDER — VANCOMYCIN HCL 10 G IV SOLR
2000.0000 mg | Freq: Once | INTRAVENOUS | Status: AC
  Administered 2013-11-30: 2000 mg via INTRAVENOUS
  Filled 2013-11-30: qty 2000

## 2013-11-30 MED ORDER — LACTATED RINGERS IV SOLN
INTRAVENOUS | Status: DC
  Administered 2013-11-30: 10:00:00 via INTRAVENOUS

## 2013-11-30 MED ORDER — CITALOPRAM HYDROBROMIDE 40 MG PO TABS
40.0000 mg | ORAL_TABLET | Freq: Every day | ORAL | Status: DC
  Administered 2013-12-01 – 2013-12-05 (×5): 40 mg via ORAL
  Filled 2013-11-30 (×6): qty 1

## 2013-11-30 MED ORDER — MIDAZOLAM HCL 2 MG/2ML IJ SOLN
INTRAMUSCULAR | Status: AC
  Filled 2013-11-30: qty 2

## 2013-11-30 MED ORDER — PROPOFOL 10 MG/ML IV BOLUS
INTRAVENOUS | Status: AC
  Filled 2013-11-30: qty 20

## 2013-11-30 MED ORDER — ONDANSETRON HCL 4 MG/2ML IJ SOLN
INTRAMUSCULAR | Status: AC
  Filled 2013-11-30: qty 2

## 2013-11-30 MED ORDER — MEPERIDINE HCL 25 MG/ML IJ SOLN: 6.2500 mg | INTRAMUSCULAR | Status: DC | PRN

## 2013-11-30 MED ORDER — GABAPENTIN 300 MG PO CAPS
300.0000 mg | ORAL_CAPSULE | Freq: Three times a day (TID) | ORAL | Status: DC
  Administered 2013-11-30 – 2013-12-05 (×17): 300 mg via ORAL
  Filled 2013-11-30 (×20): qty 1

## 2013-11-30 MED ORDER — FENTANYL CITRATE 0.05 MG/ML IJ SOLN
INTRAMUSCULAR | Status: AC
  Filled 2013-11-30: qty 5

## 2013-11-30 MED ORDER — DEXTROSE 5 % IV SOLN
1.0000 g | Freq: Two times a day (BID) | INTRAVENOUS | Status: DC
  Administered 2013-11-30 – 2013-12-01 (×2): 1 g via INTRAVENOUS
  Filled 2013-11-30 (×4): qty 1

## 2013-11-30 MED ORDER — ONDANSETRON HCL 4 MG PO TABS
4.0000 mg | ORAL_TABLET | Freq: Four times a day (QID) | ORAL | Status: DC | PRN
  Administered 2013-12-01: 4 mg via ORAL
  Filled 2013-11-30: qty 1

## 2013-11-30 MED ORDER — VANCOMYCIN HCL IN DEXTROSE 1-5 GM/200ML-% IV SOLN
1000.0000 mg | Freq: Two times a day (BID) | INTRAVENOUS | Status: DC
  Administered 2013-12-01 – 2013-12-03 (×6): 1000 mg via INTRAVENOUS
  Filled 2013-11-30 (×7): qty 200

## 2013-11-30 MED ORDER — ASPIRIN EC 325 MG PO TBEC
325.0000 mg | DELAYED_RELEASE_TABLET | Freq: Every day | ORAL | Status: DC
  Administered 2013-11-30 – 2013-12-05 (×6): 325 mg via ORAL
  Filled 2013-11-30 (×7): qty 1

## 2013-11-30 MED ORDER — LIDOCAINE HCL (CARDIAC) 20 MG/ML IV SOLN
INTRAVENOUS | Status: AC
  Filled 2013-11-30: qty 5

## 2013-11-30 MED ORDER — DEXMEDETOMIDINE HCL 200 MCG/2ML IV SOLN
40.0000 ug | INTRAVENOUS | Status: DC
  Filled 2013-11-30: qty 0.4

## 2013-11-30 MED ORDER — FENTANYL CITRATE 0.05 MG/ML IJ SOLN
25.0000 ug | INTRAMUSCULAR | Status: DC | PRN
  Administered 2013-11-30 (×2): 50 ug via INTRAVENOUS

## 2013-11-30 MED ORDER — HYDROCHLOROTHIAZIDE 25 MG PO TABS
25.0000 mg | ORAL_TABLET | Freq: Every day | ORAL | Status: DC
Start: 2013-11-30 — End: 2013-12-06
  Administered 2013-11-30 – 2013-12-05 (×6): 25 mg via ORAL
  Filled 2013-11-30 (×7): qty 1

## 2013-11-30 MED ORDER — FENTANYL CITRATE 0.05 MG/ML IJ SOLN
INTRAMUSCULAR | Status: AC
  Filled 2013-11-30: qty 2

## 2013-11-30 MED ORDER — KETAMINE HCL 100 MG/ML IJ SOLN
30.0000 mg | INTRAMUSCULAR | Status: AC
  Administered 2013-11-30: 20 mg via INTRAVENOUS

## 2013-11-30 MED ORDER — LIDOCAINE HCL (CARDIAC) 20 MG/ML IV SOLN
INTRAVENOUS | Status: DC | PRN
  Administered 2013-11-30: 50 mg via INTRAVENOUS

## 2013-11-30 MED ORDER — PROPOFOL 10 MG/ML IV BOLUS
INTRAVENOUS | Status: DC | PRN
  Administered 2013-11-30: 150 mg via INTRAVENOUS

## 2013-11-30 MED ORDER — PROMETHAZINE HCL 25 MG/ML IJ SOLN: 6.2500 mg | INTRAMUSCULAR | Status: DC | PRN

## 2013-11-30 MED ORDER — PANTOPRAZOLE SODIUM 40 MG PO TBEC
40.0000 mg | DELAYED_RELEASE_TABLET | Freq: Every day | ORAL | Status: DC
  Administered 2013-12-01 – 2013-12-05 (×5): 40 mg via ORAL
  Filled 2013-11-30 (×5): qty 1

## 2013-11-30 MED ORDER — DEXMEDETOMIDINE BOLUS VIA INFUSION
0.3600 ug/kg | Freq: Once | INTRAVENOUS | Status: DC
  Filled 2013-11-30: qty 41

## 2013-11-30 MED ORDER — ONDANSETRON HCL 4 MG/2ML IJ SOLN: 4.0000 mg | Freq: Four times a day (QID) | INTRAMUSCULAR | Status: DC | PRN

## 2013-11-30 MED ORDER — EPHEDRINE SULFATE 50 MG/ML IJ SOLN
INTRAMUSCULAR | Status: DC | PRN
  Administered 2013-11-30: 10 mg via INTRAVENOUS

## 2013-11-30 MED ORDER — HYDROCODONE-ACETAMINOPHEN 5-325 MG PO TABS
1.0000 | ORAL_TABLET | ORAL | Status: DC | PRN
  Administered 2013-11-30 – 2013-12-05 (×9): 2 via ORAL
  Filled 2013-11-30 (×10): qty 2

## 2013-11-30 MED ORDER — DEXMEDETOMIDINE BOLUS VIA INFUSION: 0.3600 ug/kg | INTRAVENOUS | Status: DC

## 2013-11-30 MED ORDER — DEXMEDETOMIDINE HCL 200 MCG/2ML IV SOLN
INTRAVENOUS | Status: DC | PRN
  Administered 2013-11-30 (×2): 10 ug via INTRAVENOUS

## 2013-11-30 MED ORDER — SODIUM CHLORIDE 0.9 % IR SOLN
Status: DC | PRN
  Administered 2013-11-30: 3000 mL

## 2013-11-30 MED ORDER — DEXAMETHASONE SODIUM PHOSPHATE 4 MG/ML IJ SOLN
INTRAMUSCULAR | Status: DC | PRN
  Administered 2013-11-30: 10 mg via INTRAVENOUS

## 2013-11-30 MED ORDER — LISINOPRIL 20 MG PO TABS
20.0000 mg | ORAL_TABLET | Freq: Every day | ORAL | Status: DC
  Administered 2013-11-30 – 2013-12-05 (×6): 20 mg via ORAL
  Filled 2013-11-30 (×7): qty 1

## 2013-11-30 MED ORDER — POTASSIUM CHLORIDE CRYS ER 10 MEQ PO TBCR
10.0000 meq | EXTENDED_RELEASE_TABLET | Freq: Every day | ORAL | Status: DC
  Administered 2013-11-30 – 2013-12-05 (×6): 10 meq via ORAL
  Filled 2013-11-30 (×7): qty 1

## 2013-11-30 MED ORDER — MIDAZOLAM HCL 5 MG/5ML IJ SOLN
INTRAMUSCULAR | Status: DC | PRN
  Administered 2013-11-30: 2 mg via INTRAVENOUS

## 2013-11-30 SURGICAL SUPPLY — 71 items
BANDAGE ELASTIC 4 VELCRO ST LF (GAUZE/BANDAGES/DRESSINGS) ×2 IMPLANT
BANDAGE ESMARK 6X9 LF (GAUZE/BANDAGES/DRESSINGS) ×1 IMPLANT
BLADE SURG 10 STRL SS (BLADE) ×2 IMPLANT
BNDG COHESIVE 4X5 TAN STRL (GAUZE/BANDAGES/DRESSINGS) ×2 IMPLANT
BNDG COHESIVE 6X5 TAN STRL LF (GAUZE/BANDAGES/DRESSINGS) ×2 IMPLANT
BNDG CONFORM 3 STRL LF (GAUZE/BANDAGES/DRESSINGS) ×2 IMPLANT
BNDG ESMARK 6X9 LF (GAUZE/BANDAGES/DRESSINGS) ×2
CANISTER SUCT 3000ML (MISCELLANEOUS) ×2 IMPLANT
CHLORAPREP W/TINT 26ML (MISCELLANEOUS) ×2 IMPLANT
CONT SPECI 4OZ STER CLIK (MISCELLANEOUS) ×4 IMPLANT
COVER SURGICAL LIGHT HANDLE (MISCELLANEOUS) ×2 IMPLANT
CUFF TOURNIQUET SINGLE 34IN LL (TOURNIQUET CUFF) ×2 IMPLANT
CUFF TOURNIQUET SINGLE 44IN (TOURNIQUET CUFF) IMPLANT
DRAIN CHANNEL 10M FLAT 3/4 FLT (DRAIN) IMPLANT
DRAIN PENROSE 1/2X12 LTX STRL (WOUND CARE) IMPLANT
DRAPE C-ARM 42X72 X-RAY (DRAPES) IMPLANT
DRAPE OEC MINIVIEW 54X84 (DRAPES) ×2 IMPLANT
DRAPE U-SHAPE 47X51 STRL (DRAPES) ×2 IMPLANT
DRSG ADAPTIC 3X8 NADH LF (GAUZE/BANDAGES/DRESSINGS) ×2 IMPLANT
DRSG MEPITEL 4X7.2 (GAUZE/BANDAGES/DRESSINGS) ×2 IMPLANT
DRSG PAD ABDOMINAL 8X10 ST (GAUZE/BANDAGES/DRESSINGS) ×2 IMPLANT
ELECT REM PT RETURN 9FT ADLT (ELECTROSURGICAL) ×2
ELECTRODE REM PT RTRN 9FT ADLT (ELECTROSURGICAL) ×1 IMPLANT
EVACUATOR SILICONE 100CC (DRAIN) IMPLANT
GAUZE SPONGE 4X4 12PLY STRL (GAUZE/BANDAGES/DRESSINGS) IMPLANT
GLOVE BIO SURGEON STRL SZ 6.5 (GLOVE) ×4 IMPLANT
GLOVE BIO SURGEON STRL SZ7 (GLOVE) ×4 IMPLANT
GLOVE BIO SURGEON STRL SZ8 (GLOVE) ×4 IMPLANT
GLOVE BIOGEL PI IND STRL 6.5 (GLOVE) ×1 IMPLANT
GLOVE BIOGEL PI IND STRL 7.5 (GLOVE) ×1 IMPLANT
GLOVE BIOGEL PI IND STRL 8 (GLOVE) ×1 IMPLANT
GLOVE BIOGEL PI INDICATOR 6.5 (GLOVE) ×1
GLOVE BIOGEL PI INDICATOR 7.5 (GLOVE) ×1
GLOVE BIOGEL PI INDICATOR 8 (GLOVE) ×1
GLOVE ECLIPSE 6.5 STRL STRAW (GLOVE) ×2 IMPLANT
GOWN STRL REUS W/ TWL LRG LVL3 (GOWN DISPOSABLE) ×2 IMPLANT
GOWN STRL REUS W/ TWL XL LVL3 (GOWN DISPOSABLE) ×1 IMPLANT
GOWN STRL REUS W/TWL LRG LVL3 (GOWN DISPOSABLE) ×2
GOWN STRL REUS W/TWL XL LVL3 (GOWN DISPOSABLE) ×1
KIT BASIN OR (CUSTOM PROCEDURE TRAY) ×2 IMPLANT
KIT ROOM TURNOVER OR (KITS) ×2 IMPLANT
NEEDLE 22X1 1/2 (OR ONLY) (NEEDLE) IMPLANT
NS IRRIG 1000ML POUR BTL (IV SOLUTION) ×2 IMPLANT
PACK ORTHO EXTREMITY (CUSTOM PROCEDURE TRAY) ×2 IMPLANT
PAD ARMBOARD 7.5X6 YLW CONV (MISCELLANEOUS) ×4 IMPLANT
PAD CAST 4YDX4 CTTN HI CHSV (CAST SUPPLIES) ×1 IMPLANT
PADDING CAST ABS 4INX4YD NS (CAST SUPPLIES) ×1
PADDING CAST ABS 6INX4YD NS (CAST SUPPLIES) ×1
PADDING CAST ABS COTTON 4X4 ST (CAST SUPPLIES) ×1 IMPLANT
PADDING CAST ABS COTTON 6X4 NS (CAST SUPPLIES) ×1 IMPLANT
PADDING CAST COTTON 4X4 STRL (CAST SUPPLIES) ×1
SET CYSTO W/LG BORE CLAMP LF (SET/KITS/TRAYS/PACK) ×2 IMPLANT
SOAP 2 % CHG 4 OZ (WOUND CARE) ×2 IMPLANT
SPLINT FIBERGLASS 4X30 (CAST SUPPLIES) ×6 IMPLANT
SPONGE GAUZE 4X4 12PLY STER LF (GAUZE/BANDAGES/DRESSINGS) ×2 IMPLANT
SPONGE LAP 4X18 X RAY DECT (DISPOSABLE) ×2 IMPLANT
STAPLER VISISTAT 35W (STAPLE) IMPLANT
SUCTION FRAZIER TIP 10 FR DISP (SUCTIONS) ×2 IMPLANT
SUT ETHILON 3 0 FSL (SUTURE) IMPLANT
SUT PROLENE 3 0 PS 2 (SUTURE) ×2 IMPLANT
SUT VIC AB 2-0 CT1 27 (SUTURE) ×2
SUT VIC AB 2-0 CT1 TAPERPNT 27 (SUTURE) ×2 IMPLANT
SUT VIC AB 3-0 PS2 18 (SUTURE) ×1
SUT VIC AB 3-0 PS2 18XBRD (SUTURE) ×1 IMPLANT
SYR CONTROL 10ML LL (SYRINGE) IMPLANT
TOWEL OR 17X24 6PK STRL BLUE (TOWEL DISPOSABLE) ×2 IMPLANT
TOWEL OR 17X26 10 PK STRL BLUE (TOWEL DISPOSABLE) ×2 IMPLANT
TUBE CONNECTING 12X1/4 (SUCTIONS) ×2 IMPLANT
TUBING CYSTO DISP (UROLOGICAL SUPPLIES) IMPLANT
WATER STERILE IRR 1000ML POUR (IV SOLUTION) ×2 IMPLANT
YANKAUER SUCT BULB TIP NO VENT (SUCTIONS) ×2 IMPLANT

## 2013-11-30 NOTE — Progress Notes (Addendum)
ANTIBIOTIC CONSULT NOTE - INITIAL  Pharmacy Consult for Vancomycin Indication: Wound infection  Allergies  Allergen Reactions  . Contrast Media [Iodinated Diagnostic Agents] Nausea And Vomiting  . Tape Rash    Patient Measurements: Height: 6' (182.9 cm) Weight: 248 lb (112.492 kg) IBW/kg (Calculated) : 77.6 Adjusted Body Weight:   Vital Signs: Temp: 98.1 F (36.7 C) (09/03 1554) BP: 124/80 mmHg (09/03 1554) Pulse Rate: 83 (09/03 1554) Intake/Output from previous day:   Intake/Output from this shift: Total I/O In: 1200 [I.V.:1200] Out: -   Labs: No results found for this basename: WBC, HGB, PLT, LABCREA, CREATININE,  in the last 72 hours Estimated Creatinine Clearance: 127.2 ml/min (by C-G formula based on Cr of 0.75). No results found for this basename: VANCOTROUGH, VANCOPEAK, VANCORANDOM, GENTTROUGH, GENTPEAK, GENTRANDOM, TOBRATROUGH, TOBRAPEAK, TOBRARND, AMIKACINPEAK, AMIKACINTROU, AMIKACIN,  in the last 72 hours   Microbiology: No results found for this or any previous visit (from the past 720 hour(s)).  Medical History: Past Medical History  Diagnosis Date  . Hypertension   . H/O hiatal hernia   . High cholesterol   . GERD (gastroesophageal reflux disease)   . OSA on CPAP   . Hemochromatosis   . Anxiety   . Depression   . Shortness of breath     with exertion    Medications:  Scheduled:  . aspirin EC  325 mg Oral Daily  . ceFEPime (MAXIPIME) IV  1 g Intravenous Q12H  . [START ON 12/01/2013] citalopram  40 mg Oral Daily  . docusate sodium  100 mg Oral BID  . [START ON 12/01/2013] enoxaparin (LOVENOX) injection  40 mg Subcutaneous Q24H  . fentaNYL      . gabapentin  300 mg Oral TID  . lisinopril  20 mg Oral Daily   And  . hydrochlorothiazide  25 mg Oral Daily  . [START ON 12/01/2013] pantoprazole  40 mg Oral Daily  . potassium chloride  10 mEq Oral Daily  . senna  1 tablet Oral BID  . simvastatin  20 mg Oral q1800   Assessment: 58yo male with L-ankle  wound dehiscence s/p ORIF, to start antibiotics.  Intra-operative cultures done.  He received Ancef x 1 dose in OR.  No labs collected, but his Cr < 1 on 8/31.  Goal of Therapy:  Vancomycin trough 10-15 mcg/mL   Plan:  1-  Vancomycin  IV x 1, then  IV q12 2-  Check Cr with AM labs, adjust dosing if necessary 3-  SS trough  Marisue Humble, PharmD Clinical Pharmacist [facility]

## 2013-11-30 NOTE — Transfer of Care (Signed)
Immediate Anesthesia Transfer of Care Note  Patient: Parker Bond  Procedure(s) Performed: Procedure(s): LEFT ANKLE REMOVAL HARDWARE, Percutaneous Tendon Lengthening (Left) INCISION AND DRAINAGE    (Left)  Patient Location: PACU  Anesthesia Type:General  Level of Consciousness: awake, alert , oriented and patient cooperative  Airway & Oxygen Therapy: Patient Spontanous Breathing and Patient connected to nasal cannula oxygen  Post-op Assessment: Report given to PACU RN, Post -op Vital signs reviewed and stable and Patient moving all extremities  Post vital signs: Reviewed and stable  Complications: No apparent anesthesia complications

## 2013-11-30 NOTE — Anesthesia Preprocedure Evaluation (Signed)
Anesthesia Evaluation  Patient identified by MRN, date of birth, ID band Patient awake    Reviewed: Allergy & Precautions, H&P , NPO status , Patient's Chart, lab work & pertinent test results  Airway       Dental   Pulmonary sleep apnea ,          Cardiovascular hypertension, Pt. on medications     Neuro/Psych Anxiety Depression    GI/Hepatic GERD-  Medicated,  Endo/Other    Renal/GU      Musculoskeletal   Abdominal   Peds  Hematology   Anesthesia Other Findings   Reproductive/Obstetrics                           Anesthesia Physical Anesthesia Plan  ASA: III  Anesthesia Plan: General   Post-op Pain Management:    Induction:   Airway Management Planned: LMA and Oral ETT  Additional Equipment:   Intra-op Plan:   Post-operative Plan:   Informed Consent: I have reviewed the patients History and Physical, chart, labs and discussed the procedure including the risks, benefits and alternatives for the proposed anesthesia with the patient or authorized representative who has indicated his/her understanding and acceptance.     Plan Discussed with:   Anesthesia Plan Comments:         Anesthesia Quick Evaluation

## 2013-11-30 NOTE — Anesthesia Procedure Notes (Signed)
Procedure Name: LMA Insertion Date/Time: 11/30/2013 12:17 PM Performed by: Coralee Rud Pre-anesthesia Checklist: Patient identified and Emergency Drugs available Patient Re-evaluated:Patient Re-evaluated prior to inductionOxygen Delivery Method: Circle system utilized Preoxygenation: Pre-oxygenation with 100% oxygen Intubation Type: IV induction Ventilation: Mask ventilation without difficulty LMA: LMA inserted LMA Size: 5.0 Number of attempts: 1 Placement Confirmation: ETT inserted through vocal cords under direct vision and positive ETCO2 Tube secured with: Tape Dental Injury: Teeth and Oropharynx as per pre-operative assessment

## 2013-11-30 NOTE — Brief Op Note (Signed)
11/30/2013  1:40 PM  PATIENT:  Parker Bond  58 y.o. male  PRE-OPERATIVE DIAGNOSIs:  Left ankle wound dehiscence s/p ORIF of tibial pilon fracture; tight left heelcord.  POST-OPERATIVE DIAGNOSIS:  same  Procedure(s): 1.  Removal of deep implants left ankle 2.  Irrigation and excisional debridement of left ankle wound 3.  Percutaneous tendo-achilles lengthening 4.  AP and lateral xrays of left ankle  SURGEON:  Toni Arthurs, MD  ASSISTANT:  ANESTHESIA:   General  EBL:  minimal   TOURNIQUET:   Total Tourniquet Time Documented: Thigh (Left) - 46 minutes Total: Thigh (Left) - 46 minutes  Specimens:  Deep tissue to micro for aerobic and anaerobic cultures  COMPLICATIONS:  None apparent  DISPOSITION:  Extubated, awake and stable to recovery.  DICTATION ID:  [id number]

## 2013-11-30 NOTE — H&P (Signed)
Parker Bond is an 58 y.o. male.   Chief Complaint:  Left leg wound HPI:  58 y/o male with h/o left tibial pilon fracture at work several months ago.  He has had persistent drainage from the left leg wound.  Radiographs show adequate healing for removal of the hardware in an attempt to heal the leg wound.  Past Medical History  Diagnosis Date  . Hypertension   . H/O hiatal hernia   . High cholesterol   . GERD (gastroesophageal reflux disease)   . OSA on CPAP   . Hemochromatosis   . Anxiety   . Depression   . Shortness of breath     with exertion    Past Surgical History  Procedure Laterality Date  . Tibia fracture surgery Left 06/22/2013    APPLICATION OF EXTERNAL FIXATION FOR LEFT PILON FRACTURE; closed reduction  . Tonsillectomy    . Inguinal hernia repair Bilateral   . External fixation leg Left 06/22/2013    Procedure:  APPLICATION OF EXTERNAL FIXATION FOR LEFT PILON FRACTURE;  Surgeon: Parker Rossetti, MD;  Location: [facility] OR;  Service: Orthopedics;  Laterality: Left;  . Rotator cuff repair Left 06/2013  . Repair / resect / transplant biceps tendon Left 08/2013  . Cyst excision Left     index finger    History reviewed. No pertinent family history. Social History:  reports that he has quit smoking. His smoking use included Cigarettes. He has a 15 pack-year smoking history. He has never used smokeless tobacco. He reports that he drinks alcohol. He reports that he does not use illicit drugs.  Allergies:  Allergies  Allergen Reactions  . Contrast Media [Iodinated Diagnostic Agents] Nausea And Vomiting  . Tape Rash    Medications Prior to Admission  Medication Sig Dispense Refill  . acetaminophen (TYLENOL) 500 MG tablet Take 1,000-2,000 mg by mouth 2 (two) times daily as needed for moderate pain.      Marland Kitchen aspirin EC 325 MG tablet Take 325 mg by mouth daily.      . cephALEXin (KEFLEX) 500 MG capsule Take 500 mg by mouth 4 (four) times daily.      . citalopram (CELEXA) 40 MG  tablet Take 40 mg by mouth daily.      . diphenhydramine-acetaminophen (TYLENOL PM) 25-500 MG TABS Take 2 tablets by mouth at bedtime.      . gabapentin (NEURONTIN) 300 MG capsule Take 300 mg by mouth 3 (three) times daily.      Marland Kitchen lisinopril-hydrochlorothiazide (PRINZIDE,ZESTORETIC) 20-25 MG per tablet Take 1 tablet by mouth daily.      Marland Kitchen omeprazole (PRILOSEC) 40 MG capsule Take 40 mg by mouth daily.      . potassium chloride (K-DUR,KLOR-CON) 10 MEQ tablet Take 10 mEq by mouth daily.      . pravastatin (PRAVACHOL) 20 MG tablet Take 20 mg by mouth at bedtime.         No results found for this or any previous visit (from the past 48 hour(s)). No results found.  ROS  No recent f/c/n/v/wt loss  Blood pressure 135/76, pulse 81, resp. rate 20, height 6' (1.829 m), weight 112.492 kg (248 lb), SpO2 99.00%. Physical Exam  wn wd male in nad.  A and Ox 4.  Mood and affect normal.  EOMI.  resp unlabored.  L leg with healing anterior leg wound.  PF contracture of ankle 15 deg.  5/5 strength in PF and DF of the ankle.  No cellulitis.  No  purulence.  No lymphadenopathy.  Assessment/Plan Delayed healing of left ankle wound with persistent drainage s/p ORIF of tibial pilon fracture - to OR for removal of hardware, possible achilles tendon lengthening, possible application of wound VAC.  The risks and benefits of the alternative treatment options have been discussed in detail.  The patient wishes to proceed with surgery and specifically understands risks of bleeding, infection, nerve damage, blood clots, need for additional surgery, amputation and death.   HEWITTJonny Bond 2013/12/24, 11:59 AM

## 2013-11-30 NOTE — Progress Notes (Signed)
Utilization review completed.  

## 2013-11-30 NOTE — Op Note (Signed)
NAMEKRISTOFER, SCHAFFERT NO.:  [id number]  MEDICAL RECORD NO.:  [id number]  LOCATION:  [id number]                        FACILITY:  [facility]  PHYSICIAN:  Toni Arthurs, MD        DATE OF BIRTH:  09/03/1955  DATE OF PROCEDURE:  11/30/2013 DATE OF DISCHARGE:                              OPERATIVE REPORT   PREOPERATIVE DIAGNOSIS: 1. Left ankle wound dehiscence, status post open reduction and     internal fixation of tibial pilon fracture. 2. Tight left heel cord.  POSTOPERATIVE DIAGNOSIS: 1. Left ankle wound dehiscence, status post open reduction and     internal fixation of tibial pilon fracture. 2. Tight left heel cord.  PROCEDURE: 1. Removal of deep implants, left ankle. 2. Irrigation and excisional debridement of left ankle wound. 3. Percutaneous tendo-Achilles lengthening. 4. AP and lateral radiographs of the left ankle.  SURGEON:  Toni Arthurs, MD.  ASSISTANT:  Lucretia Kern, PA-C.  ANESTHESIA:  General.  ESTIMATED BLOOD LOSS:  Minimal.  TOURNIQUET TIME:  46 minutes at 250 mmHg.  SPECIMEN:  Deep tissue to Microbiology for aerobic and anaerobic cultures.  COMPLICATIONS:  None apparent.  DISPOSITION:  Extubated, awake, and stable to recovery.  INDICATIONS FOR PROCEDURE:  The patient is a 58 year old male with a history of a tibial pilon fracture sustained at work in the spring of this year.  He underwent ORIF of the pilon fracture and removal of his external fixator on June 22, 2013.  He experienced dehiscence of his wound in several places along the length of the wound over the last month or so.  He has been on suppressive antibiotics with periodic drainage from the wound.  His radiographs now show adequate healing to allow removal of the hardware.  He presents now for removal of the hardware, I and D of his wound, and Achilles tendon lengthening.  His heel cord is tight and he has about a 15-degree plantar flexion contracture.  He understands the risks  and benefits, the alternative treatment options, and elects surgical treatment.  He specifically understands risks of bleeding, infection, nerve damage, blood clots, need for additional surgery, continued pain, amputation, and death.  PROCEDURE IN DETAIL:  After preoperative consent was obtained, the correct operative site was identified, the patient was brought to the operating room and placed supine on the operating table.  General anesthesia was induced.  Preoperative antibiotics were administered. Surgical time-out was taken.  Left lower extremity was prepped and draped in standard sterile fashion, tourniquet around the thigh.  The extremity was exsanguinated, and tourniquet was inflated to 250 mmHg. The patient's previous surgical incision was identified.  It was opened again sharply.  The areas of wound dehiscence were all excised in full- thickness pieces.  The interval between the tibialis anterior and extensor hallucis longus was identified and developed.  The plate was identified.  There was a layer of slime along the plate.  Several specimens were obtained from this layer and sent to Microbiology for aerobic and anaerobic culture.  The plate was then fully exposed.  All of the locking and nonlocking screws were removed without difficulty. The plate was removed in its entirety.  The  wound was then sharply debrided of all fibrinous and nonviable tissue.  This was done excisionally with a curette rongeur and scissors.  Once all this material was removed, wound was irrigated with 3 L of normal saline. Again circumferential debridement was taken from the level of skin down to the level of the bone removing all fibrinous material.  1 L of irrigation was used to clean the wound.  AP and lateral radiographs confirmed appropriate removal of all hardware.  Posterior screw and washer remaining, these were not within the area of the anterior wound. Horizontal mattress sutures of 3-0 nylon  were used to close the skin incision.  The ankle range of motion was noted to be limited in dorsiflexion.  The patient was noted to have a 15-degree plantar flexion contracture.  The decision was made to proceed with Achilles tendon lengthening.  A triple hemisection tendo-Achilles lengthening was performed with a #15 blade. This allowed dorsiflexion of the ankle to about 15 degrees passively. Sterile dressings were applied to the ankle wounds followed by well- padded short leg cast.  The patient was awakened from anesthesia and transported to the recovery room in stable condition.  FOLLOWUP PLAN:  The patient will be nonweightbearing on the left lower extremity.  He will be admitted to the hospital and started on IV vancomycin and Zosyn.  Infectious Disease consultation will be obtained for initiation of IV antibiotics as needed.  He will start Lovenox for DVT prophylaxis.  Lucretia Kern, PA-C, was present, scrubbed for the duration of the case. Her assistance was essentially gaining and maintaining exposure, performing the hardware removal, I and D, and closing and dressing of the wound.  RADIOGRAPHS:  AP and lateral radiographs of the left ankle were obtained intraoperatively today.  These show interval removal of the anterior plate and screws.  The fracture site appears stable.     Toni Arthurs, MD     JH/MEDQ  D:  11/30/2013  T:  11/30/2013  Job:  [id number]

## 2013-11-30 NOTE — Consult Note (Addendum)
[place] [facility] for Infectious Disease  Date of Admission:  11/30/2013  Date of Consult:  11/30/2013  Reason for Consult: Osteomyelitis L ankle Referring Physician: Doran Durand  Impression/Recommendation Osteomyelitis L ankle  Would Await Cx Check ESR and CRP Continue cefepime Check HIV May need vanco, PIC...  Thank you so much for this interesting consult,   Bobby Rumpf (pager) [phone] [email or web address]  Parker Bond is an 58 y.o. male.  HPI: 58 yo M with hx of fall March 2015 with resulting L ankle fracture. He had external fixateur placed as well as pins. This was replaced with plates July 11, 368.  Since that time he has had persistent drainage from his wounds, mostly from his medial ankle. He has received keflex at home to try and suppress his drainage but this has not resolved. The drainage has been intermittent, thick, yellow. From either of 2 wounds on his ankle. He states that he has had 3 courses of anbx over the last 3 months. He was most recently on keflex for the last 3 weeks.  He is brought to OR today and removal of most (but not all) of his hardware. There was a slime layer noted in the OR.  He has been started on cefepime.   Past Medical History  Diagnosis Date  . Hypertension   . H/O hiatal hernia   . High cholesterol   . GERD (gastroesophageal reflux disease)   . OSA on CPAP   . Hemochromatosis   . Anxiety   . Depression   . Shortness of breath     with exertion    Past Surgical History  Procedure Laterality Date  . Tibia fracture surgery Left 4/88/8916    APPLICATION OF EXTERNAL FIXATION FOR LEFT PILON FRACTURE; closed reduction  . Tonsillectomy    . Inguinal hernia repair Bilateral   . External fixation leg Left 06/22/2013    Procedure:  APPLICATION OF EXTERNAL FIXATION FOR LEFT PILON FRACTURE;  Surgeon: Augustin Schooling, MD;  Location: [place];  Service: Orthopedics;  Laterality: Left;  . Rotator cuff repair Left 06/2013  . Repair /  resect / transplant biceps tendon Left 08/2013  . Cyst excision Left     index finger     Allergies  Allergen Reactions  . Contrast Media [Iodinated Diagnostic Agents] Nausea And Vomiting  . Tape Rash    Medications:  Scheduled: . aspirin EC  325 mg Oral Daily  . ceFEPime (MAXIPIME) IV  1 g Intravenous Q12H  . [START ON 12/01/2013] citalopram  40 mg Oral Daily  . docusate sodium  100 mg Oral BID  . [START ON 12/01/2013] enoxaparin (LOVENOX) injection  40 mg Subcutaneous Q24H  . fentaNYL      . gabapentin  300 mg Oral TID  . lisinopril  20 mg Oral Daily   And  . hydrochlorothiazide  25 mg Oral Daily  . [START ON 12/01/2013] pantoprazole  40 mg Oral Daily  . potassium chloride  10 mEq Oral Daily  . senna  1 tablet Oral BID  . simvastatin  20 mg Oral q1800    Abtx:  Anti-infectives   Start     Dose/Rate Route Frequency Ordered Stop   11/30/13 2200  ceFEPIme (MAXIPIME) 1 g in dextrose 5 % 50 mL IVPB     1 g 100 mL/hr over 30 Minutes Intravenous Every 12 hours 11/30/13 1617     11/30/13 0600  ceFAZolin (ANCEF) IVPB 2 g/50 mL premix  2 g 100 mL/hr over 30 Minutes Intravenous On call to O.R. 11/29/13 1349 11/30/13 1213      Total days of antibiotics 0 (cefepime)          Social History:  reports that he has quit smoking. His smoking use included Cigarettes. He has a 15 pack-year smoking history. He has never used smokeless tobacco. He reports that he drinks alcohol. He reports that he does not use illicit drugs.  History reviewed. No pertinent family history.  General ROS: has felt hot. no fever or chills. normal BM. normal urination. no hx of DM. wt up 20#. No proximal erythema. see HPI  Blood pressure 124/80, pulse 83, temperature 98.1 F (36.7 C), resp. rate 18, height 6' (1.829 m), weight 112.492 kg (248 lb), SpO2 94.00%. General appearance: alert, cooperative and no distress Eyes: negative findings: conjunctivae and sclerae normal and pupils equal, round, reactive to  light and accomodation Throat: lips, mucosa, and tongue normal; teeth and gums normal Neck: no adenopathy and supple, symmetrical, trachea midline Lungs: clear to auscultation bilaterally Heart: regular rate and rhythm Abdomen: normal findings: bowel sounds normal and soft, non-tender Extremities: cast on LLE. swelling, numbness in L toes. no proximal erythema.    No results found for this or any previous visit (from the past 48 hour(s)). No results found for this basename: sdes, specrequest, cult, reptstatus   No results found. No results found for this or any previous visit (from the past 240 hour(s)).    11/30/2013, 4:18 PM     LOS: 0 days

## 2013-11-30 NOTE — Anesthesia Postprocedure Evaluation (Signed)
  Anesthesia Post-op Note  Patient: Parker Bond  Procedure(s) Performed: Procedure(s): LEFT ANKLE REMOVAL HARDWARE, Percutaneous Tendon Lengthening (Left) INCISION AND DRAINAGE    (Left)  Patient Location: PACU  Anesthesia Type:General  Level of Consciousness: awake and alert   Airway and Oxygen Therapy: Patient Spontanous Breathing and Patient connected to nasal cannula oxygen  Post-op Pain: mild  Post-op Assessment: Post-op Vital signs reviewed, Patient's Cardiovascular Status Stable and Respiratory Function Stable  Post-op Vital Signs: Reviewed and stable  Last Vitals:  Filed Vitals:   11/30/13 1345  BP:   Pulse: 76  Temp:   Resp: 13    Complications: No apparent anesthesia complications

## 2013-12-01 ENCOUNTER — Encounter (HOSPITAL_COMMUNITY): Payer: Self-pay | Admitting: Orthopedic Surgery

## 2013-12-01 LAB — BASIC METABOLIC PANEL
ANION GAP: 15 (ref 5–15)
BUN: 11 mg/dL (ref 6–23)
CO2: 22 mEq/L (ref 19–32)
Calcium: 8.8 mg/dL (ref 8.4–10.5)
Chloride: 97 mEq/L (ref 96–112)
Creatinine, Ser: 0.76 mg/dL (ref 0.50–1.35)
GFR calc Af Amer: >90 mL/min (ref >90)
Glucose, Bld: 281 mg/dL — ABNORMAL HIGH (ref 70–99)
POTASSIUM: 3.7 meq/L (ref 3.7–5.3)
SODIUM: 134 meq/L — AB (ref 137–147)

## 2013-12-01 LAB — HIV ANTIBODY (ROUTINE TESTING W REFLEX): HIV: NONREACTIVE

## 2013-12-01 LAB — C-REACTIVE PROTEIN

## 2013-12-01 MED ORDER — HYDROMORPHONE HCL 2 MG PO TABS
2.0000 mg | ORAL_TABLET | ORAL | Status: DC | PRN
  Administered 2013-12-01 – 2013-12-03 (×7): 4 mg via ORAL
  Filled 2013-12-01 (×9): qty 2

## 2013-12-01 MED ORDER — DEXTROSE 5 % IV SOLN
2.0000 g | Freq: Two times a day (BID) | INTRAVENOUS | Status: DC
  Administered 2013-12-01 – 2013-12-05 (×9): 2 g via INTRAVENOUS
  Filled 2013-12-01 (×11): qty 2

## 2013-12-01 MED ORDER — DIPHENHYDRAMINE HCL 12.5 MG/5ML PO ELIX
25.0000 mg | ORAL_SOLUTION | Freq: Four times a day (QID) | ORAL | Status: DC | PRN
  Administered 2013-12-01 (×2): 50 mg via ORAL
  Administered 2013-12-01: 25 mg via ORAL
  Administered 2013-12-04: 50 mg via ORAL
  Administered 2013-12-04: 25 mg via ORAL
  Administered 2013-12-05: 50 mg via ORAL
  Filled 2013-12-01: qty 10
  Filled 2013-12-01 (×4): qty 20
  Filled 2013-12-01: qty 10

## 2013-12-01 MED ORDER — SODIUM CHLORIDE 0.9 % IJ SOLN
10.0000 mL | INTRAMUSCULAR | Status: DC | PRN
  Administered 2013-12-02 – 2013-12-06 (×5): 10 mL

## 2013-12-01 MED ORDER — OXYCODONE HCL 5 MG PO TABS
5.0000 mg | ORAL_TABLET | ORAL | Status: DC | PRN
  Administered 2013-12-01: 10 mg via ORAL
  Filled 2013-12-01: qty 2

## 2013-12-01 NOTE — Progress Notes (Signed)
Subjective: 1 Day Post-Op Procedure(s) (LRB): LEFT ANKLE REMOVAL HARDWARE, Percutaneous Tendon Lengthening (Left) INCISION AND DRAINAGE    (Left) Patient reports pain as moderate.  He states that his pain was not controlled well last night and that oxycodone caused him to itch. He was switched to IV dilauded, which helped his pain significantly. He states that benadryl helped his itching and is okay to ry oxy again with benadryl. Otherwise, he has no new complaints. He denies any new HA, CP, sOB, N, V, fever, chills, calf pain or swelling. He understands that cultures were taken and are awaiting results for final d/c recs.   Objective: Vital signs in last 24 hours: Temp:  [97.4 F (36.3 C)-98.2 F (36.8 C)] 97.9 F (36.6 C) (09/04 0533) Pulse Rate:  [71-92] 78 (09/04 0533) Resp:  [9-22] 16 (09/04 0533) BP: (107-150)/(60-107) 107/68 mmHg (09/04 0533) SpO2:  [93 %-99 %] 94 % (09/04 0533) Weight:  [112.492 kg (248 lb)] 112.492 kg (248 lb) (09/03 1011)  Intake/Output from previous day: 09/03 0701 - 09/04 0700 In: 1633.8 [P.O.:240; I.V.:1343.8; IV Piggyback:50] Out: -  Intake/Output this shift:    No results found for this basename: HGB,  in the last 72 hours No results found for this basename: WBC, RBC, HCT, PLT,  in the last 72 hours  Recent Labs  12/01/13 0410  NA 134*  K 3.7  CL 97  CO2 22  BUN 11  CREATININE 0.76  GLUCOSE 281*  CALCIUM 8.8   No results found for this basename: LABPT, INR,  in the last 72 hours  WD/WN caucasian male in nad. Mood and affect appropriate. A and O x4. EOMI. Respirations normal and unlabored. RLE in hard cast. NV intact with brisk capillary refill and distal sensation intact bilaterally. 5/5 strength of toe extensors and flexors. No calf swelling or palpable cords. No lymphadenopathy.   Assessment/Plan: 1 Day Post-Op Procedure(s) (LRB): LEFT ANKLE REMOVAL HARDWARE, Percutaneous Tendon Lengthening (Left) INCISION AND DRAINAGE     (Left) Continue ABX therapy due to Culture taken of surgical site during joint revision. Cultures pending, on IV Zosyn and Vancomycin. Will follow ID recs.  -Up with PT, NWB RLE -Continue pain medications as prescribed; try to wean off dilauded to oxycodone . Take benadryl  with oxycodone if tolerable.  -Lovenox  daily for DVT prophylaxis   Naava Janeway HOWELLS 12/01/2013, 7:41 AM

## 2013-12-01 NOTE — Evaluation (Signed)
Physical Therapy Evaluation Patient Details Name: Parker Bond MRN: [id number] DOB: 06-18-1955 Today's Date: 12/01/2013   History of Present Illness  pt presents post I+D with hardware removal of L ankle.    Clinical Impression  Pt demos good safety and use of knee scooter in room and down hallway.  Pt has been utilizing knee scooter for mobility since March 2015 and appears to be at baseline level.  No further acute PT needs at this time.  Will sign off.      Follow Up Recommendations No PT follow up    Equipment Recommendations  None recommended by PT    Recommendations for Other Services       Precautions / Restrictions Precautions Precautions: None Restrictions Weight Bearing Restrictions: Yes LLE Weight Bearing: Non weight bearing      Mobility  Bed Mobility Overal bed mobility: Modified Independent                Transfers Overall transfer level: Modified independent Equipment used:  (Knee scooter)             General transfer comment: pt demos good technique for using one hand on handle of scooter with brakes and other hand on bed to come to stand and demos similar technique with toilet transfer and transfer to chair.    Ambulation/Gait Ambulation/Gait assistance: Modified independent (Device/Increase time) Ambulation Distance (Feet): 140 Feet Assistive device:  (knee scooter)       General Gait Details: pt demos ModI with knee scooter to mobilize in room and down hallway.  Only A was for management of IV pole.    Stairs            Wheelchair Mobility    Modified Rankin (Stroke Patients Only)       Balance Overall balance assessment: No apparent balance deficits (not formally assessed)                                           Pertinent Vitals/Pain Pain Assessment: 0-10 Pain Score: 3  Pain Location: L ankle Pain Descriptors / Indicators: Aching Pain Intervention(s): Premedicated before session;Repositioned     Home Living Family/patient expects to be discharged to:: Private residence Living Arrangements: Spouse/significant other Available Help at Discharge: Family;Available PRN/intermittently Type of Home: House Home Access: Ramped entrance     Home Layout: One level Home Equipment: Wheelchair - power;Wheelchair - Radio broadcast assistant)      Prior Function Level of Independence: Independent with assistive device(s)               Hand Dominance        Extremity/Trunk Assessment   Upper Extremity Assessment: Overall WFL for tasks assessed           Lower Extremity Assessment: Overall WFL for tasks assessed;LLE deficits/detail   LLE Deficits / Details: pt in short leg cast, otherwise WFL.  Cervical / Trunk Assessment: Normal  Communication   Communication: No difficulties  Cognition Arousal/Alertness: Awake/alert Behavior During Therapy: WFL for tasks assessed/performed Overall Cognitive Status: Within Functional Limits for tasks assessed                      General Comments      Exercises        Assessment/Plan    PT Assessment Patent does not need any further PT services  PT Diagnosis  PT Problem List    PT Treatment Interventions     PT Goals (Current goals can be found in the Care Plan section) Acute Rehab PT Goals PT Goal Formulation: No goals set, d/c therapy    Frequency     Barriers to discharge        Co-evaluation               End of Session   Activity Tolerance: Patient tolerated treatment well Patient left: in chair;with call bell/phone within reach;with family/visitor present Nurse Communication: Mobility status         Time: 4010-2725 PT Time Calculation (min): 26 min   Charges:   PT Evaluation $Initial PT Evaluation Tier I: 1 Procedure PT Treatments $Therapeutic Activity: 8-22 mins   PT G CodesSunny Schlein, [place] [phone] 12/01/2013, 9:07 AM

## 2013-12-01 NOTE — Progress Notes (Signed)
Inpatient Diabetes Program Recommendations  AACE/ADA: New Consensus Statement on Inpatient Glycemic Control (2013)  Target Ranges:  Prepandial:   less than 140 mg/dL      Peak postprandial:   less than 180 mg/dL (1-2 hours)      Critically ill patients:  140 - 180 mg/dL   Reason for Assessment:  Results for RHYKER, SILVERSMITH (MRN [id number]) as of 12/01/2013 10:23  Ref. Range 12/01/2013 04:10  Glucose Latest Range: 70-99 mg/dL 782 (H)   Diabetes history: None Outpatient Diabetes medications: None Current orders for Inpatient glycemic control: Note elevated glucose on labs this morning.  Patient did receive Decadron 10 mg on 11/30/13 which may have contributed to elevated glucose.    Please consider checking CBG's tid with meals and HS.  If greater than 140 mg/dL, please add Novolog sensitive correction.  Also consider checking A1C to determine prehospitalization glycemic control.  Thanks, Beryl Meager, RN, BC-ADM Inpatient Diabetes Coordinator Pager [phone]

## 2013-12-01 NOTE — Progress Notes (Signed)
The patient's wife is asking if she can be trained on administering IV abx in the hospital via a PICC line. She has concern for him having to wait in the hospital until Sept. 8th for discharge, when Home Health is available to come to his home. While CM works on this issue, can we also determine if the wife is able to substitute as Home Health? The plan is for him to receive the PICC Saturday- per the IV team. Will continue to monitor.

## 2013-12-01 NOTE — Progress Notes (Signed)
Patient's pain not effectively controlled with current pain medication regimen.  Contacted on call, [employer], [place]. Orders received to add Oxycodone IR 5-10 mg tab po q3h prn for moderate to severe pain.  Will continue to monitor patient.

## 2013-12-01 NOTE — Care Management Note (Signed)
CARE MANAGEMENT NOTE 12/01/2013  Patient:  Parker Bond, Parker Bond   Account Number:  [id number]  Date Initiated:  12/01/2013  Documentation initiated by:  Vance Peper  Subjective/Objective Assessment:   58 yr old male admitted with left ankle wound dehiscence, osteomyelitis of left ankle. S/P removal of some implants, I & D of left ankle and achilles tendon strengthening.     Action/Plan:   Patient is under worker's comp.Wife provided info: claim#[id number] DOI:06/22/13  Enid Skeens with [employer]  [phone].   Anticipated DC Date:  12/02/2013   Anticipated DC Plan:  HOME W HOME HEALTH SERVICES      DC Planning Services  CM consult      [facility] Choice  HOME HEALTH   Choice offered to / List presented to:     DME arranged  NA        HH arranged  HH-1 RN  IV Antibiotics      HH agency  OTHER - SEE NOTE   Status of service:  In process, will continue to follow Medicare Important Message given?   (If response is "NO", the following Medicare IM given date fields will be blank) Date Medicare IM given:   Medicare IM given by:   Date Additional Medicare IM given:   Additional Medicare IM given by:    Discharge Disposition:    Per UR Regulation:  Reviewed for med. necessity/level of care/duration of stay  If discussed at Long Length of Stay Meetings, dates discussed:    Comments:  12/01/13  2:22pm  Anselm Jungling, RN BSN Case Manager CM spoke with Patient's Adjuster-Nicole Glenetta Hew, referred to [facility] to establish home health- [phone], Debbe Odea is at [phone]. Orders will be faxed to [phone]. Referral # [id number]. [facility] is arranging for IV antibiotics. Spoke with Morrie Sheldon [phone] Fax: [phone]. She will be working Saturday 12/02/13 from 9-3:30 pm, if assistance is needed.

## 2013-12-01 NOTE — Progress Notes (Signed)
Patient complains of itching, on call Dimitri Ped, PA contacted.  Orders received to discontinue oxycodone.  Orders also received to give Benadryl 25-50 mg  po q6h prn for itching.  Additional order to add Dilaudid 2-4 mg po q4h prn for moderate to severe pain.  Will continue to monitor patient.

## 2013-12-01 NOTE — Progress Notes (Addendum)
INFECTIOUS DISEASE PROGRESS NOTE  ID: Parker Bond is a 58 y.o. male with  Active Problems:   Nonhealing surgical wound  Subjective: Without complaints. itiching from pain meds.   Abtx:  Anti-infectives   Start     Dose/Rate Route Frequency Ordered Stop   12/01/13 0600  vancomycin (VANCOCIN) IVPB 1000 mg/200 mL premix     1,000 mg 200 mL/hr over 60 Minutes Intravenous Every 12 hours 11/30/13 1644     11/30/13 2200  ceFEPIme (MAXIPIME) 1 g in dextrose 5 % 50 mL IVPB     1 g 100 mL/hr over 30 Minutes Intravenous Every 12 hours 11/30/13 1617     11/30/13 1730  vancomycin (VANCOCIN) 2,000 mg in sodium chloride 0.9 % 500 mL IVPB     2,000 mg 250 mL/hr over 120 Minutes Intravenous  Once 11/30/13 1644 11/30/13 1904   11/30/13 0600  ceFAZolin (ANCEF) IVPB 2 g/50 mL premix     2 g 100 mL/hr over 30 Minutes Intravenous On call to O.R. 11/29/13 1349 11/30/13 1213      Medications:  Scheduled: . aspirin EC  325 mg Oral Daily  . ceFEPime (MAXIPIME) IV  1 g Intravenous Q12H  . citalopram  40 mg Oral Daily  . docusate sodium  100 mg Oral BID  . enoxaparin (LOVENOX) injection  40 mg Subcutaneous Q24H  . gabapentin  300 mg Oral TID  . lisinopril  20 mg Oral Daily   And  . hydrochlorothiazide  25 mg Oral Daily  . pantoprazole  40 mg Oral Daily  . potassium chloride  10 mEq Oral Daily  . senna  1 tablet Oral BID  . simvastatin  20 mg Oral q1800  . vancomycin  1,000 mg Intravenous Q12H    Objective: Vital signs in last 24 hours: Temp:  [97.7 F (36.5 C)-98.2 F (36.8 C)] 97.9 F (36.6 C) (09/04 0533) Pulse Rate:  [71-92] 72 (09/04 0820) Resp:  [10-20] 20 (09/04 0820) BP: (107-134)/(60-82) 112/69 mmHg (09/04 0820) SpO2:  [93 %-98 %] 97 % (09/04 0820)   General appearance: alert, cooperative and no distress Extremities: LLE in cast. mild 2+ edema. asensate toes (for > 5 months).   Lab Results  Recent Labs  12/01/13 0410  NA 134*  K 3.7  CL 97  CO2 22  BUN 11    CREATININE 0.76   Liver Panel No results found for this basename: PROT, ALBUMIN, AST, ALT, ALKPHOS, BILITOT, BILIDIR, IBILI,  in the last 72 hours Sedimentation Rate  Recent Labs  11/30/13 1906  ESRSEDRATE 7   C-Reactive Protein  Recent Labs  11/30/13 1906  CRP <0.5*    Microbiology: Recent Results (from the past 240 hour(s))  ANAEROBIC CULTURE     Status: None   Collection Time    11/30/13 12:43 PM      Result Value Ref Range Status   Specimen Description TISSUE ANKLE LEFT   Final   Special Requests     Final   Value: DEEP TISSUE WOUND CULTURE LEFT ANKLE PT ON ANCEF KEFLES   Gram Stain     Final   Value: FEW WBC PRESENT, PREDOMINANTLY MONONUCLEAR     NO SQUAMOUS EPITHELIAL CELLS SEEN     NO ORGANISMS SEEN     Performed at [facility]   Culture     Final   Value: NO ANAEROBES ISOLATED; CULTURE IN PROGRESS FOR 5 DAYS     Performed at [facility]  Report Status PENDING   Incomplete  TISSUE CULTURE     Status: None   Collection Time    11/30/13 12:43 PM      Result Value Ref Range Status   Specimen Description TISSUE ANKLE LEFT   Final   Special Requests DEEP TISSUE WOUND CULTURE PT ON ANCEF KEFLEX   Final   Gram Stain     Final   Value: FEW WBC PRESENT, PREDOMINANTLY MONONUCLEAR     NO SQUAMOUS EPITHELIAL CELLS SEEN     NO ORGANISMS SEEN     Performed at [facility]   Culture     Final   Value: NO GROWTH 1 DAY     Performed at [facility]   Report Status PENDING   Incomplete    Studies/Results: No results found.   Assessment/Plan: Osteomyelitis L ankle  Total days of antibiotics: 2 vanco/cefepime  Will place [facility] Plan for home anbx. 6 weeks.  Await Cxs         Johny Sax Infectious Diseases (pager) [phone] [email or web address] 12/01/2013, 2:02 PM  LOS: 1 day

## 2013-12-01 NOTE — Progress Notes (Signed)
OT Cancellation Note  Patient Details Name: Parker Bond MRN: [id number] DOB: 10-18-1955   Cancelled Treatment:    Reason Eval/Treat Not Completed: OT screened, no needs identified, will sign off. Pt Mod I with PT earlier today with knee scooter and when speaking with pt and family they do not have any concerns about BADLs since they have been dealing with this issue for several months.   Evette Georges [phone] 12/01/2013, 4:12 PM

## 2013-12-01 NOTE — Progress Notes (Signed)
Peripherally Inserted Central Catheter/Midline Placement  The IV Nurse has discussed with the patient and/or persons authorized to consent for the patient, the purpose of this procedure and the potential benefits and risks involved with this procedure.  The benefits include less needle sticks, lab draws from the catheter and patient may be discharged home with the catheter.  Risks include, but not limited to, infection, bleeding, blood clot (thrombus formation), and puncture of an artery; nerve damage and irregular heat beat.  Alternatives to this procedure were also discussed.  PICC/Midline Placement Documentation  PICC / Midline Single Lumen 12/01/13 PICC Right Basilic 50 cm 1 cm (Active)  Indication for Insertion or Continuance of Line Home intravenous therapies (PICC only) 12/01/2013  8:25 PM  Exposed Catheter (cm) 1 cm 12/01/2013  8:25 PM  Site Assessment Clean;Dry;Intact 12/01/2013  8:25 PM  Line Status Flushed;Saline locked 12/01/2013  8:25 PM  Dressing Type Transparent 12/01/2013  8:25 PM  Dressing Status Clean;Dry;Intact;Antimicrobial disc in place 12/01/2013  8:25 PM  Dressing Change Due 12/08/13 12/01/2013  8:25 PM       Netta Corrigan L 12/01/2013, 8:54 PM

## 2013-12-02 MED ORDER — MAGNESIUM HYDROXIDE 400 MG/5ML PO SUSP: 30.0000 mL | Freq: Every day | ORAL | Status: DC | PRN

## 2013-12-02 MED ORDER — DOCUSATE SODIUM 283 MG RE ENEM
1.0000 | ENEMA | Freq: Every day | RECTAL | Status: DC | PRN
  Filled 2013-12-02: qty 1

## 2013-12-02 MED ORDER — ALUM & MAG HYDROXIDE-SIMETH 200-200-20 MG/5ML PO SUSP: 30.0000 mL | Freq: Four times a day (QID) | ORAL | Status: DC | PRN

## 2013-12-02 NOTE — Progress Notes (Signed)
Patient ID: Parker Bond, male   DOB: 11/18/1955, 58 y.o.   MRN: [id number] Subjective: 2 Days Post-Op Procedure(s) (LRB): LEFT ANKLE REMOVAL HARDWARE, Percutaneous Tendon Lengthening (Left) INCISION AND DRAINAGE    (Left)    Patient reports pain as mild.  Stable but burning sensation at time.  Otherwise comfortable in cast, no events, pain tolerable  Objective:   VITALS:   Filed Vitals:   12/02/13 0618  BP: 119/77  Pulse: 85  Temp: 97.7 F (36.5 C)  Resp: 18    Neurovascular intact Toes a bit swollen, but cap refill intact  LABS No results found for this basename: HGB, HCT, WBC, PLT,  in the last 72 hours   Cultures:  No growth to date   Recent Labs  12/01/13 0410  NA 134*  K 3.7  BUN 11  CREATININE 0.76  GLUCOSE 281*    No results found for this basename: LABPT, INR,  in the last 72 hours   Assessment/Plan: 2 Days Post-Op Procedure(s) (LRB): LEFT ANKLE REMOVAL HARDWARE, Percutaneous Tendon Lengthening (Left) INCISION AND DRAINAGE    (Left)   Advance diet Up with therapy Continue ABX therapy due to Culture taken of surgical site of left ankle  Follow cultures  PIC line placed, awaiting antibiotic selection based on bacterial growth and sensitivities

## 2013-12-02 NOTE — Progress Notes (Signed)
The patient's mother was admitted to 3W and he asked to go see her.  The MD was notified and permission was granted for the patient to leave the floor  The nurse tech took the patient down to 3W via wheelchair and they returned approximately twenty minutes later.

## 2013-12-02 NOTE — Progress Notes (Signed)
Patient alert and oriented, unable get in touch with nurse for workers comp, patient probably stay with [place] until Tuesday, iv antibx, will be d/c on iv antibiotics, picc medlocked, received order for indigestion and stool  [employer]

## 2013-12-02 NOTE — Progress Notes (Signed)
INFECTIOUS DISEASE PROGRESS NOTE  ID: Parker Bond is a 58 y.o. male with  Active Problems:   Nonhealing surgical wound  Subjective: No complaints.   Abtx:  Anti-infectives   Start     Dose/Rate Route Frequency Ordered Stop   12/01/13 2200  ceFEPIme (MAXIPIME) 2 g in dextrose 5 % 50 mL IVPB     2 g 100 mL/hr over 30 Minutes Intravenous Every 12 hours 12/01/13 1542     12/01/13 0600  vancomycin (VANCOCIN) IVPB 1000 mg/200 mL premix     1,000 mg 200 mL/hr over 60 Minutes Intravenous Every 12 hours 11/30/13 1644     11/30/13 2200  ceFEPIme (MAXIPIME) 1 g in dextrose 5 % 50 mL IVPB  Status:  Discontinued     1 g 100 mL/hr over 30 Minutes Intravenous Every 12 hours 11/30/13 1617 12/01/13 1542   11/30/13 1730  vancomycin (VANCOCIN) 2,000 mg in sodium chloride 0.9 % 500 mL IVPB     2,000 mg 250 mL/hr over 120 Minutes Intravenous  Once 11/30/13 1644 11/30/13 1904   11/30/13 0600  ceFAZolin (ANCEF) IVPB 2 g/50 mL premix     2 g 100 mL/hr over 30 Minutes Intravenous On call to O.R. 11/29/13 1349 11/30/13 1213      Medications:  Scheduled: . aspirin EC  325 mg Oral Daily  . ceFEPime (MAXIPIME) IV  2 g Intravenous Q12H  . citalopram  40 mg Oral Daily  . docusate sodium  100 mg Oral BID  . enoxaparin (LOVENOX) injection  40 mg Subcutaneous Q24H  . gabapentin  300 mg Oral TID  . lisinopril  20 mg Oral Daily   And  . hydrochlorothiazide  25 mg Oral Daily  . pantoprazole  40 mg Oral Daily  . potassium chloride  10 mEq Oral Daily  . senna  1 tablet Oral BID  . simvastatin  20 mg Oral q1800  . vancomycin  1,000 mg Intravenous Q12H    Objective: Vital signs in last 24 hours: Temp:  [97.7 F (36.5 C)-97.9 F (36.6 C)] 97.7 F (36.5 C) (09/05 0618) Pulse Rate:  [70-85] 85 (09/05 0618) Resp:  [18] 18 (09/05 0618) BP: (113-124)/(66-80) 119/77 mmHg (09/05 0618) SpO2:  [95 %-99 %] 97 % (09/05 0618)   General appearance: alert, cooperative and no distress Extremities: RUE PIC.  LLE cast on, no proximal erythema.   Lab Results  Recent Labs  12/01/13 0410  NA 134*  K 3.7  CL 97  CO2 22  BUN 11  CREATININE 0.76   Liver Panel No results found for this basename: PROT, ALBUMIN, AST, ALT, ALKPHOS, BILITOT, BILIDIR, IBILI,  in the last 72 hours Sedimentation Rate  Recent Labs  11/30/13 1906  ESRSEDRATE 7   C-Reactive Protein  Recent Labs  11/30/13 1906  CRP <0.5*    Microbiology: Recent Results (from the past 240 hour(s))  ANAEROBIC CULTURE     Status: None   Collection Time    11/30/13 12:43 PM      Result Value Ref Range Status   Specimen Description TISSUE ANKLE LEFT   Final   Special Requests     Final   Value: DEEP TISSUE WOUND CULTURE LEFT ANKLE PT ON ANCEF KEFLES   Gram Stain     Final   Value: FEW WBC PRESENT, PREDOMINANTLY MONONUCLEAR     NO SQUAMOUS EPITHELIAL CELLS SEEN     NO ORGANISMS SEEN     Performed at [facility]  Culture     Final   Value: NO ANAEROBES ISOLATED; CULTURE IN PROGRESS FOR 5 DAYS     Performed at [facility]   Report Status PENDING   Incomplete  TISSUE CULTURE     Status: None   Collection Time    11/30/13 12:43 PM      Result Value Ref Range Status   Specimen Description TISSUE ANKLE LEFT   Final   Special Requests DEEP TISSUE WOUND CULTURE PT ON ANCEF KEFLEX   Final   Gram Stain     Final   Value: FEW WBC PRESENT, PREDOMINANTLY MONONUCLEAR     NO SQUAMOUS EPITHELIAL CELLS SEEN     NO ORGANISMS SEEN     Performed at [facility]   Culture     Final   Value: NO GROWTH 1 DAY     Performed at [facility]   Report Status PENDING   Incomplete    Studies/Results: No results found.   Assessment/Plan: Osteomyelitis L ankle   Total days of antibiotics: 3 vanco/cefepime Await Cx results, may be negative due to po anbx PIC in place         Parker Bond Infectious Diseases (pager) [phone] [email or web address] 12/02/2013, 11:09 AM  LOS: 2 days

## 2013-12-03 LAB — TISSUE CULTURE: Culture: NO GROWTH

## 2013-12-03 LAB — CREATININE, SERUM
Creatinine, Ser: 0.78 mg/dL (ref 0.50–1.35)
GFR calc Af Amer: >90 mL/min
GFR calc non Af Amer: >90 mL/min

## 2013-12-03 LAB — VANCOMYCIN, TROUGH: Vancomycin Tr: 9.1 ug/mL — ABNORMAL LOW (ref 10.0–20.0)

## 2013-12-03 MED ORDER — VANCOMYCIN HCL 10 G IV SOLR
1500.0000 mg | Freq: Two times a day (BID) | INTRAVENOUS | Status: DC
  Administered 2013-12-04 – 2013-12-06 (×5): 1500 mg via INTRAVENOUS
  Filled 2013-12-03 (×6): qty 1500

## 2013-12-03 NOTE — Progress Notes (Addendum)
ANTIBIOTIC CONSULT NOTE - Follow up  Pharmacy Consult for Vancomycin Indication: Wound infection  Allergies  Allergen Reactions  . Contrast Media [Iodinated Diagnostic Agents] Nausea And Vomiting  . Tape Rash    Patient Measurements: Height: 6' (182.9 cm) Weight: 248 lb (112.492 kg) IBW/kg (Calculated) : 77.6 Adjusted Body Weight:   Vital Signs: Temp: 98 F (36.7 C) (09/06 0538) Temp src: Oral (09/06 0538) BP: 121/72 mmHg (09/06 0538) Pulse Rate: 75 (09/06 0538) Intake/Output from previous day: 09/05 0701 - 09/06 0700 In: 730 [P.O.:720; I.V.:10] Out: 250 [Urine:250] Intake/Output from this shift:    Labs:  Recent Labs  12/01/13 0410 12/03/13 0459  CREATININE 0.76 0.78   Estimated Creatinine Clearance: 127.2 ml/min (by C-G formula based on Cr of 0.78). No results found for this basename: VANCOTROUGH, Leodis Binet, VANCORANDOM, GENTTROUGH, GENTPEAK, GENTRANDOM, TOBRATROUGH, TOBRAPEAK, TOBRARND, AMIKACINPEAK, AMIKACINTROU, AMIKACIN,  in the last 72 hours   Microbiology: Recent Results (from the past 720 hour(s))  ANAEROBIC CULTURE     Status: None   Collection Time    11/30/13 12:43 PM      Result Value Ref Range Status   Specimen Description TISSUE ANKLE LEFT   Final   Special Requests     Final   Value: DEEP TISSUE WOUND CULTURE LEFT ANKLE PT ON ANCEF KEFLES   Gram Stain     Final   Value: FEW WBC PRESENT, PREDOMINANTLY MONONUCLEAR     NO SQUAMOUS EPITHELIAL CELLS SEEN     NO ORGANISMS SEEN     Performed at [facility]   Culture     Final   Value: NO ANAEROBES ISOLATED; CULTURE IN PROGRESS FOR 5 DAYS     Performed at [facility]   Report Status PENDING   Incomplete  TISSUE CULTURE     Status: None   Collection Time    11/30/13 12:43 PM      Result Value Ref Range Status   Specimen Description TISSUE ANKLE LEFT   Final   Special Requests DEEP TISSUE WOUND CULTURE PT ON ANCEF KEFLEX   Final   Gram Stain     Final   Value: FEW WBC PRESENT,  PREDOMINANTLY MONONUCLEAR     NO SQUAMOUS EPITHELIAL CELLS SEEN     NO ORGANISMS SEEN     Performed at [facility]   Culture     Final   Value: NO GROWTH 3 DAYS     Performed at [facility]   Report Status 12/03/2013 FINAL   Final    Medical History: Past Medical History  Diagnosis Date  . Hypertension   . H/O hiatal hernia   . High cholesterol   . GERD (gastroesophageal reflux disease)   . OSA on CPAP   . Hemochromatosis   . Anxiety   . Depression   . Shortness of breath     with exertion    Medications:  Scheduled:  . aspirin EC  325 mg Oral Daily  . ceFEPime (MAXIPIME) IV  2 g Intravenous Q12H  . citalopram  40 mg Oral Daily  . docusate sodium  100 mg Oral BID  . enoxaparin (LOVENOX) injection  40 mg Subcutaneous Q24H  . gabapentin  300 mg Oral TID  . lisinopril  20 mg Oral Daily   And  . hydrochlorothiazide  25 mg Oral Daily  . pantoprazole  40 mg Oral Daily  . potassium chloride  10 mEq Oral Daily  . senna  1 tablet Oral  BID  . simvastatin  20 mg Oral q1800  . vancomycin  1,000 mg Intravenous Q12H   Assessment: 58yo male with L-ankle wound dehiscence s/p ORIF on Day #4 vancomycin and cefepime. Afebrile. SCr =0.75 today, stable, estimated CrCl >100 ml/min. ID consulted & notes plan is for 6 weeks of vanco/cefepime. PICC in place.    Tissue culture (left ankle) no growth/ final.   Goal of Therapy:  Vancomycin trough 10-15 mcg/mL   ADDENDUM: increase goal to 15-20  Plan:  Continue Vancomycin   IV q12 Vancomycin troug at steady state @ 05:30 tomorrow.  Noah Delaine, RPh Clinical Pharmacist Pager: [phone]    Addendum:    Dx: osteomyelitis L. Ankle.  Will increase goal for vancomycin trough to 15-20 mcg/ml.   Plan: Check Vanc Trough  tonight at 17:30 and adjust dose as needed to target trough 15-20 mcg/ml.  Plan is for 6 weeks of vanco/cefepime.  Noah Delaine, RPh Clinical Pharmacist Pager: [phone]    Addendum:   Vancomycin level is subtherapeutic at 9.1 mcg/ml. Noted ID plans to continue antibiotics for 6 weeks - will try to keep on q12h hour regimen to facilitate discharge/outpatient care.   Goal: Vancomycin trough 15-20 mcg/ml  Plan: 1. Increase Vancomycin to  IV q12h 2. Monitor renal function and check follow-up trough at [facility], PharmD Clinical Pharmacist [phone] 12/03/2013, 8:01 PM

## 2013-12-03 NOTE — Progress Notes (Signed)
    Subjective: 3 Days Post-Op Procedure(s) (LRB): LEFT ANKLE REMOVAL HARDWARE, Percutaneous Tendon Lengthening (Left) INCISION AND DRAINAGE    (Left) Patient reports pain as 4 on 0-10 scale.   Denies CP or SOB.  Voiding without difficulty. Positive flatus. Objective: Vital signs in last 24 hours: Temp:  [97.9 F (36.6 C)-98 F (36.7 C)] 98 F (36.7 C) (09/06 0538) Pulse Rate:  [75-78] 75 (09/06 0538) Resp:  [16] 16 (09/06 0538) BP: (116-121)/(60-72) 121/72 mmHg (09/06 0538) SpO2:  [96 %-97 %] 97 % (09/06 0538)  Intake/Output from previous day: 09/05 0701 - 09/06 0700 In: 730 [P.O.:720; I.V.:10] Out: 250 [Urine:250] Intake/Output this shift:    Labs: No results found for this basename: HGB,  in the last 72 hours No results found for this basename: WBC, RBC, HCT, PLT,  in the last 72 hours  Recent Labs  12/01/13 0410 12/03/13 0459  NA 134*  --   K 3.7  --   CL 97  --   CO2 22  --   BUN 11  --   CREATININE 0.76 0.78  GLUCOSE 281*  --   CALCIUM 8.8  --    No results found for this basename: LABPT, INR,  in the last 72 hours  Physical Exam: Intact pulses distally No cellulitis present Compartment soft cast intact   Assessment/Plan: 3 Days Post-Op Procedure(s) (LRB): LEFT ANKLE REMOVAL HARDWARE, Percutaneous Tendon Lengthening (Left) INCISION AND DRAINAGE    (Left) Up with therapy Continue ABX therapy due to Post-op infection  Eloyce Bultman D for Dr. Venita Lick [facility] [phone] 12/03/2013, 9:34 AM

## 2013-12-03 NOTE — Progress Notes (Signed)
INFECTIOUS DISEASE PROGRESS NOTE  ID: Parker Bond is a 58 y.o. male with  Active Problems:   Nonhealing surgical wound  Subjective: Without complaints  Abtx:  Anti-infectives   Start     Dose/Rate Route Frequency Ordered Stop   12/01/13 2200  ceFEPIme (MAXIPIME) 2 g in dextrose 5 % 50 mL IVPB     2 g 100 mL/hr over 30 Minutes Intravenous Every 12 hours 12/01/13 1542     12/01/13 0600  vancomycin (VANCOCIN) IVPB 1000 mg/200 mL premix     1,000 mg 200 mL/hr over 60 Minutes Intravenous Every 12 hours 11/30/13 1644     11/30/13 2200  ceFEPIme (MAXIPIME) 1 g in dextrose 5 % 50 mL IVPB  Status:  Discontinued     1 g 100 mL/hr over 30 Minutes Intravenous Every 12 hours 11/30/13 1617 12/01/13 1542   11/30/13 1730  vancomycin (VANCOCIN) 2,000 mg in sodium chloride 0.9 % 500 mL IVPB     2,000 mg 250 mL/hr over 120 Minutes Intravenous  Once 11/30/13 1644 11/30/13 1904   11/30/13 0600  ceFAZolin (ANCEF) IVPB 2 g/50 mL premix     2 g 100 mL/hr over 30 Minutes Intravenous On call to O.R. 11/29/13 1349 11/30/13 1213      Medications:  Scheduled: . aspirin EC  325 mg Oral Daily  . ceFEPime (MAXIPIME) IV  2 g Intravenous Q12H  . citalopram  40 mg Oral Daily  . docusate sodium  100 mg Oral BID  . enoxaparin (LOVENOX) injection  40 mg Subcutaneous Q24H  . gabapentin  300 mg Oral TID  . lisinopril  20 mg Oral Daily   And  . hydrochlorothiazide  25 mg Oral Daily  . pantoprazole  40 mg Oral Daily  . potassium chloride  10 mEq Oral Daily  . senna  1 tablet Oral BID  . simvastatin  20 mg Oral q1800  . vancomycin  1,000 mg Intravenous Q12H    Objective: Vital signs in last 24 hours: Temp:  [97.9 F (36.6 C)-98 F (36.7 C)] 98 F (36.7 C) (09/06 0538) Pulse Rate:  [75-78] 75 (09/06 0538) Resp:  [16] 16 (09/06 0538) BP: (116-121)/(60-72) 121/72 mmHg (09/06 0538) SpO2:  [96 %-97 %] 97 % (09/06 0538)   General appearance: alert, cooperative and no distress Extremities: no change  in LLE. cast in place, mild erythema.   Lab Results  Recent Labs  12/01/13 0410 12/03/13 0459  NA 134*  --   K 3.7  --   CL 97  --   CO2 22  --   BUN 11  --   CREATININE 0.76 0.78   Liver Panel No results found for this basename: PROT, ALBUMIN, AST, ALT, ALKPHOS, BILITOT, BILIDIR, IBILI,  in the last 72 hours Sedimentation Rate  Recent Labs  11/30/13 1906  ESRSEDRATE 7   C-Reactive Protein  Recent Labs  11/30/13 1906  CRP <0.5*    Microbiology: Recent Results (from the past 240 hour(s))  ANAEROBIC CULTURE     Status: None   Collection Time    11/30/13 12:43 PM      Result Value Ref Range Status   Specimen Description TISSUE ANKLE LEFT   Final   Special Requests     Final   Value: DEEP TISSUE WOUND CULTURE LEFT ANKLE PT ON ANCEF KEFLES   Gram Stain     Final   Value: FEW WBC PRESENT, PREDOMINANTLY MONONUCLEAR     NO SQUAMOUS EPITHELIAL CELLS  SEEN     NO ORGANISMS SEEN     Performed at [facility]   Culture     Final   Value: NO ANAEROBES ISOLATED; CULTURE IN PROGRESS FOR 5 DAYS     Performed at [facility]   Report Status PENDING   Incomplete  TISSUE CULTURE     Status: None   Collection Time    11/30/13 12:43 PM      Result Value Ref Range Status   Specimen Description TISSUE ANKLE LEFT   Final   Special Requests DEEP TISSUE WOUND CULTURE PT ON ANCEF KEFLEX   Final   Gram Stain     Final   Value: FEW WBC PRESENT, PREDOMINANTLY MONONUCLEAR     NO SQUAMOUS EPITHELIAL CELLS SEEN     NO ORGANISMS SEEN     Performed at [facility]   Culture     Final   Value: NO GROWTH 3 DAYS     Performed at [facility]   Report Status 12/03/2013 FINAL   Final    Studies/Results: No results found.   Assessment/Plan: Osteomyelitis L ankle   Total days of antibiotics: 4 vanco/cefepime  His Cx are (-)  PIC in place Would plan for 6 weeks of vanco/cefepime Will have him f/u in our clinic.          Parker Bond Infectious  Diseases (pager) [phone] [email or web address] 12/03/2013, 12:47 PM  LOS: 3 days

## 2013-12-04 MED ORDER — BISACODYL 10 MG RE SUPP
10.0000 mg | Freq: Every day | RECTAL | Status: DC | PRN
  Administered 2013-12-04 – 2013-12-05 (×2): 10 mg via RECTAL
  Filled 2013-12-04 (×2): qty 1

## 2013-12-04 NOTE — Discharge Instructions (Signed)
Toni Arthurs, MD [facility]  Please read the following information regarding your care after surgery.  Medications  You only need a prescription for the narcotic pain medicine (ex. oxycodone, Percocet, Norco).  All of the other medicines listed below are available over the counter. X acetominophen (Tylenol) 650 mg every 4-6 hours as you need for minor pain X hydrocodone as prescribed for moderate to severe pain   Narcotic pain medicine (ex. oxycodone, Percocet, Vicodin) will cause constipation.  To prevent this problem, take the following medicines while you are taking any pain medicine. X docusate sodium (Colace) 100 mg twice a day X senna (Senokot) 2 tablets twice a day  X To help prevent blood clots, take an aspirin (325 mg) once a day for a month after surgery.  You should also get up every hour while you are awake to move around.    Weight Bearing X Do not bear any weight on the operated leg or foot.  Cast / Splint / Dressing X Keep your splint or cast clean and dry.  Dont put anything (coat hanger, pencil, etc) down inside of it.  If it gets damp, use a hair dryer on the cool setting to dry it.  If it gets soaked, call the office to schedule an appointment for a cast change.  After your dressing, cast or splint is removed; you may shower, but do not soak or scrub the wound.  Allow the water to run over it, and then gently pat it dry.  Swelling It is normal for you to have swelling where you had surgery.  To reduce swelling and pain, keep your toes above your nose for at least 3 days after surgery.  It may be necessary to keep your foot or leg elevated for several weeks.  If it hurts, it should be elevated.  Follow Up Call my office at [phone] when you are discharged from the hospital or surgery center to schedule an appointment to be seen two weeks after surgery.  Call my office at [phone] if you develop a fever >101.5 F, nausea, vomiting, bleeding from the  surgical site or severe pain.

## 2013-12-04 NOTE — Progress Notes (Signed)
   Subjective: 4 Days Post-Op Procedure(s) (LRB): LEFT ANKLE REMOVAL HARDWARE, Percutaneous Tendon Lengthening (Left) INCISION AND DRAINAGE    (Left) Patient reports pain as mild.   Patient seen in rounds for Dr. Lequita Halt. Patient is well, but has had some minor complaints of pain in the ankle, requiring pain medications   Objective: Vital signs in last 24 hours: Temp:  [97.4 F (36.3 C)-98.2 F (36.8 C)] 97.4 F (36.3 C) (09/07 0644) Pulse Rate:  [73-87] 73 (09/07 0644) Resp:  [16-18] 16 (09/07 0644) BP: (118-133)/(67-76) 118/69 mmHg (09/07 0644) SpO2:  [95 %-96 %] 95 % (09/07 0644)  Intake/Output from previous day:  Intake/Output Summary (Last 24 hours) at 12/04/13 1057 Last data filed at 12/04/13 7829  Gross per 24 hour  Intake     10 ml  Output      0 ml  Net     10 ml    Intake/Output this shift: Total I/O In: 10 [I.V.:10] Out: -   Labs: No results found for this basename: HGB,  in the last 72 hours No results found for this basename: WBC, RBC, HCT, PLT,  in the last 72 hours  Recent Labs  12/03/13 0459  CREATININE 0.78   No results found for this basename: LABPT, INR,  in the last 72 hours  EXAM General - Patient is Alert, Appropriate and Oriented Extremity - Neurovascular intact Sensation intact distally except to the great toe which was preexisting, not a new finding. Dressing - short leg cast in good condition Motor Function - intact, moving foot and toes well on exam.   Past Medical History  Diagnosis Date  . Hypertension   . H/O hiatal hernia   . High cholesterol   . GERD (gastroesophageal reflux disease)   . OSA on CPAP   . Hemochromatosis   . Anxiety   . Depression   . Shortness of breath     with exertion    Assessment/Plan: 4 Days Post-Op Procedure(s) (LRB): LEFT ANKLE REMOVAL HARDWARE, Percutaneous Tendon Lengthening (Left) INCISION AND DRAINAGE    (Left) Active Problems:   Nonhealing surgical wound  Estimated body mass index is  33.63 kg/(m^2) as calculated from the following:   Height as of this encounter: 6' (1.829 m).   Weight as of this encounter: 112.492 kg (248 lb). Continue current treatment plan Dr. Victorino Dike to return tomorrow  Avel Peace, PA-C Orthopaedic Surgery 12/04/2013, 10:57 AM

## 2013-12-05 LAB — HEMOGLOBIN A1C
Hgb A1c MFr Bld: 6.5 % — ABNORMAL HIGH (ref <5.7)
Mean Plasma Glucose: 140 mg/dL — ABNORMAL HIGH (ref <117)

## 2013-12-05 MED ORDER — HYDROCODONE-ACETAMINOPHEN 5-325 MG PO TABS: 1.0000 | ORAL_TABLET | Freq: Four times a day (QID) | ORAL | Status: DC | PRN

## 2013-12-05 MED ORDER — DEXTROSE 5 % IV SOLN: 2.0000 g | Freq: Two times a day (BID) | INTRAVENOUS | Status: DC

## 2013-12-05 MED ORDER — VANCOMYCIN HCL 10 G IV SOLR: 1500.0000 mg | Freq: Two times a day (BID) | INTRAVENOUS | Status: DC

## 2013-12-05 NOTE — Discharge Summary (Signed)
Physician Discharge Summary  Patient ID: Parker Bond MRN: [id number] DOB/AGE: 03-Mar-1956 58 y.o.  Admit date: 11/30/2013 Discharge date: 12/05/2013  Admission Diagnoses: nonhealing surgical wound  Discharge Diagnoses:  Active Problems:   Nonhealing surgical wound    Hyperglycemia   Discharged Condition: good  Hospital Course: Parker Bond presented to [facility] OR on 11/30/13 for removal of hardware, I and D, and tissue cultures oh his Left nonhealing surgical wound. He tolerated the procedure well and did not have any immediate complications. He has some difficulty with oxycodone as it made him itch, despite benadryl. It was d/c and he was transitioned from IV dilauded to PO hydrocodone. His pain has been well tolerated and he has not had any new complaints during his stay.   Tissue cultures were sent for micro, but did not grow any bacteria; however, he had been taking PO Keflex up until sx and he had Ancef in the pre-operative period. ID was consulted and recs include PICC line with IV vanc and cefepime x 6 weeks.   Parker Bond was found to be hyperglycemic during his stay at [facility]. An A1C was ordered on the morning of d/c. If A1C is elevated, he is advised to follow-up with his PCP for evaluation and treatment.   Today, 9/8, Parker Bond has no new complaints and is eager to go home. His pain is well controlled with PO pain medications. He denies any itching. He also denies any new HA, CP, SOB, N, V, fever, chills, calf pain or swelling.   Consults: ID  Significant Diagnostic Studies: microbiology- no growth; on Keflex and Ancef at time of tissue cultures  Treatments: antibiotics: vancomycin and cefepime   Discharge Exam: Blood pressure 115/67, pulse 73, temperature 97.5 F (36.4 C), temperature source Oral, resp. rate 16, height 6' (1.829 m), weight 112.492 kg (248 lb), SpO2 97.00%. WD/WN caucasian male in nad. A and O x4. Mood and affect appropriate. EOMI. Respriations normal and unlabored.  LLE in hard cast. cast C/D/I. NV intact with brisk capillary refill and 5/5 strength of toe extensors and flexors. distal sensations intact bilaterally. No calf swelling or palpable cords. no lymphadenopathy. PICC line in place RUE.   Disposition: 01-Home or Self Care     Medication List    ASK your doctor about these medications       acetaminophen 500 MG tablet  Commonly known as:  TYLENOL  Take 1,000-2,000 mg by mouth 2 (two) times daily as needed for moderate pain.     aspirin EC 325 MG tablet  Take 325 mg by mouth daily.     cephALEXin 500 MG capsule  Commonly known as:  KEFLEX  Take 500 mg by mouth 4 (four) times daily.     citalopram 40 MG tablet  Commonly known as:  CELEXA  Take 40 mg by mouth daily.     diphenhydramine-acetaminophen 25-500 MG Tabs  Commonly known as:  TYLENOL PM  Take 2 tablets by mouth at bedtime.     gabapentin 300 MG capsule  Commonly known as:  NEURONTIN  Take 300 mg by mouth 3 (three) times daily.     lisinopril-hydrochlorothiazide 20-25 MG per tablet  Commonly known as:  PRINZIDE,ZESTORETIC  Take 1 tablet by mouth daily.     omeprazole 40 MG capsule  Commonly known as:  PRILOSEC  Take 40 mg by mouth daily.     potassium chloride 10 MEQ tablet  Commonly known as:  K-DUR,KLOR-CON  Take 10 mEq by mouth daily.  pravastatin 20 MG tablet  Commonly known as:  PRAVACHOL  Take 20 mg by mouth at bedtime.           Follow-up Information   Follow up with Toni Arthurs, MD In 10 days.   Specialty:  Orthopedic Surgery   Contact information:   [address] [place] [address] [phone]       Follow up with Johny Sax, MD. Schedule an appointment as soon as possible for a visit in 1 month.   Specialty:  Infectious Diseases   Contact information:   [address] [place] [address] [phone]                     -Continue PO pain medications for pain management -NWB LLE -ASA   daily for DVT prophylaxis  -IV Vanc and cefepime via PICC line; f/u with ID in 6 weeks -f/u with PCP, Dr. Josiah Lobo at [facility] for hyperglycemia and A1C results   Signed: Christiana Fuchs HOWELLS 12/05/2013, 7:15 AM   Addendum:  Parker Bond was unable to be d/c to home on 9/8 due to awaiting approval for medications/ home health needs with worker's comp. He will be d/c this morning, 9/9. D/C orders were changed to reflect this change. He has no new complaints today and is ready to go home. He has his Rx for ABX and PO pain medications. He will f/u with [place] in clinic in 10 days. He is advised to f/u with his PCP for his elevated A1C of 6.5.

## 2013-12-05 NOTE — Care Management Note (Signed)
Comments:  12/05/13 11:15am Vance Peper, RN BSN Case Manager CM receieved return call from [place] with [facility]. CM faxed discharge summary and prescriptions for Vancomycin 1,500mg  every 12 hours  and Cefepime 2grams IV every 12 hours  to her at [phone].  Home Health agency being arranged by [facility], Debbe Odea or Marylene Land will contact CM with name of Home Health Agency.

## 2013-12-05 NOTE — Care Management Note (Signed)
12/05/13 2:15pm Vance Peper, RN BSN Case Manager CM contacted Morrie Sheldon with [facility] concerning delivery of antibiotics and start of nursing care. Morrie Sheldon informed me that medications can  not be delivered until 12/06/13 @ 8AM. Case manager informed her that patient requires 2 doses between now and tomorrow. Patient will have to remain in hospital until am of 12/06/13, Bedside RN to notify Dr. Victorino Dike of the situation. RN from agency named [employer], aarranged through [employer] , will arrive at patient's home between 10-11:00am. Case manager discussed all details with patient and his wife.

## 2013-12-06 LAB — ANAEROBIC CULTURE

## 2013-12-06 MED ORDER — HEPARIN SOD (PORK) LOCK FLUSH 100 UNIT/ML IV SOLN
250.0000 [IU] | INTRAVENOUS | Status: AC | PRN
  Administered 2013-12-06: 250 [IU]

## 2013-12-14 ENCOUNTER — Telehealth: Payer: Self-pay | Admitting: *Deleted

## 2013-12-14 NOTE — Telephone Encounter (Signed)
Per Dr. Ninetta Lights, called patient's home health agency [facility]) to request repeat CBC.  Spoke with Clydie Braun, Data processing manager.  SHe will contact patient's pharmacy ([facility]) to get authorization. Once she has the authorization, she can send the nurse out for the repeat CBC due to H//H = 9.4/28.2. Andree Coss, RN

## 2013-12-18 ENCOUNTER — Telehealth: Payer: Self-pay | Admitting: *Deleted

## 2013-12-18 NOTE — Telephone Encounter (Signed)
Due to the pt's hemochromatosis blood was too thick to be drawn from PICC or from peripheral stick.  HH RN was able to draw labs this AM.  Wife reports that she gave the pt ASA and Gatorade over the weekend to hydrate him.    Left message.  Requested CBC be added to 12/18/13 blood work drawn by [employer].

## 2013-12-18 NOTE — Telephone Encounter (Signed)
Pt's wife called about lab work.   Pt w/ dx of hemochromatosis.  Sees Dr. Fredricka Bonine in [facility] for this diagnosis.   HHRN had difficutly drawing CBC from the pt's PICC line.  The blood clotted as is entered the vial.  The [facility] then tried a peripheral stick on the opposite arm.  She was able to obtain only a small sample prior to the blood clotting.    [facility] aware of the blood draw difficulty.  Have sent the peripheral blood draw sample to the lab for analysis.

## 2014-01-02 ENCOUNTER — Ambulatory Visit (INDEPENDENT_AMBULATORY_CARE_PROVIDER_SITE_OTHER): Payer: Worker's Compensation | Admitting: Infectious Diseases

## 2014-01-02 ENCOUNTER — Telehealth: Payer: Self-pay | Admitting: *Deleted

## 2014-01-02 ENCOUNTER — Encounter: Payer: Self-pay | Admitting: Infectious Diseases

## 2014-01-02 VITALS — BP 124/85 | HR 84 | Temp 98.2°F | Ht 72.0 in | Wt 247.0 lb

## 2014-01-02 DIAGNOSIS — R7309 Other abnormal glucose: Secondary | ICD-10-CM

## 2014-01-02 DIAGNOSIS — B372 Candidiasis of skin and nail: Secondary | ICD-10-CM | POA: Insufficient documentation

## 2014-01-02 DIAGNOSIS — T8189XD Other complications of procedures, not elsewhere classified, subsequent encounter: Secondary | ICD-10-CM

## 2014-01-02 DIAGNOSIS — Z23 Encounter for immunization: Secondary | ICD-10-CM

## 2014-01-02 LAB — CBC WITH DIFFERENTIAL/PLATELET
BASOS PCT: 0 % (ref 0–1)
Basophils Absolute: 0 10*3/uL (ref 0.0–0.1)
Eosinophils Absolute: 0.1 10*3/uL (ref 0.0–0.7)
Eosinophils Relative: 4 % (ref 0–5)
HCT: 39.3 % (ref 39.0–52.0)
Hemoglobin: 13.1 g/dL (ref 13.0–17.0)
Lymphocytes Relative: 38 % (ref 12–46)
Lymphs Abs: 1.1 10*3/uL (ref 0.7–4.0)
MCH: 28.1 pg (ref 26.0–34.0)
MCHC: 33.3 g/dL (ref 30.0–36.0)
MCV: 84.3 fL (ref 78.0–100.0)
MONO ABS: 0.4 10*3/uL (ref 0.1–1.0)
Monocytes Relative: 12 % (ref 3–12)
NEUTROS ABS: 1.4 10*3/uL — AB (ref 1.7–7.7)
NEUTROS PCT: 46 % (ref 43–77)
Platelets: 244 10*3/uL (ref 150–400)
RBC: 4.66 MIL/uL (ref 4.22–5.81)
RDW: 14.9 % (ref 11.5–15.5)
WBC: 3 10*3/uL — ABNORMAL LOW (ref 4.0–10.5)

## 2014-01-02 MED ORDER — SULFAMETHOXAZOLE-TMP DS 800-160 MG PO TABS: 1.0000 | ORAL_TABLET | Freq: Two times a day (BID) | ORAL | Status: DC

## 2014-01-02 MED ORDER — FLUCONAZOLE 100 MG PO TABS: 100.0000 mg | ORAL_TABLET | Freq: Every day | ORAL | Status: DC

## 2014-01-02 NOTE — Progress Notes (Signed)
Subjective:    Patient ID: Parker Bond, male    DOB: 1955-11-08, 59 y.o.   MRN: [id number]  HPI 58 yo M with hx of fall March 2015 with resulting L ankle fracture. He had external fixateur placed as well as pins. This was replaced with plates July 11, 298.  Since that time he had persistent drainage from his wounds, mostly from his medial ankle. He received keflex at home to try and suppress his drainage but did not resolve. The drainage has been intermittent, thick, yellow. From either of 2 wounds on his ankle. He had 3 courses of anbx over the last 3 months. He was on keflex for 3 weeks then presented 11-30-13 and had removal of most (but not all) of his hardware. There was a slime layer noted in the OR. ESR 7. CRP <05 He was started on cefepime/vanco. His Cx were negative and he was d/c home on 9-8 on cefepime/vanco.  Since d/c he has developed "alot of fungus infection" in groin and arm pits. Has been bearing "very little" weight on his ankle. Has been "touching it to the floor" with a boot on, using a walker. On 10-1, he developed a draining pustule on his anterior ankle. He was seen by Dr Doran Durand who thought it could be a stitch abscess. Since then has had clear fluid, occas blood. Swelling has improved. No fever or chills but has had "hot flashes on the inside".   Stop date 10-15.   He had lab draw that was not able to be completed to due blood clotted.   Review of Systems  Constitutional: Negative for appetite change and unexpected weight change.  Gastrointestinal: Negative for diarrhea and constipation.  Genitourinary: Negative for difficulty urinating.       Objective:   Physical Exam  Constitutional: He appears well-developed and well-nourished.  HENT:  Mouth/Throat: No oropharyngeal exudate.  Eyes: EOM are normal. Pupils are equal, round, and reactive to light.  Neck: Neck supple.  Cardiovascular: Normal rate, regular rhythm and normal heart sounds.   Pulmonary/Chest:  Effort normal and breath sounds normal.  Abdominal: Soft. Bowel sounds are normal. He exhibits distension. There is no tenderness.  Lymphadenopathy:    He has no cervical adenopathy.  Skin:             Assessment & Plan:

## 2014-01-02 NOTE — Addendum Note (Signed)
Addended by: Myron Lona C on: 01/02/2014 04:14 PM   Modules accepted: Orders

## 2014-01-02 NOTE — Assessment & Plan Note (Addendum)
Will give him 2 weeks of diflucan. Drug interaction noted, will stop citralopram.

## 2014-01-02 NOTE — Telephone Encounter (Signed)
Verbal order per Dr. Ninetta LightsHatcher given to nurse with [facility] to pull patient's picc line on 01/11/14 [name]

## 2014-01-02 NOTE — Assessment & Plan Note (Addendum)
His Cx were (-), his ESR and CRP were normal. I have some concern about the recent draining pustule. Will complete his anbx as scheduled, plan to change him to bactrim at end of his IV course. Plan for 12 months of bactrim.  Will recheck his labs today.   Add- he has had slightly elevated abn GLC. Will need to f/u. Will add A1C.

## 2014-01-03 ENCOUNTER — Telehealth: Payer: Self-pay | Admitting: *Deleted

## 2014-01-03 LAB — COMPREHENSIVE METABOLIC PANEL
ALBUMIN: 4.1 g/dL (ref 3.5–5.2)
ALT: 41 U/L (ref 0–53)
AST: 48 U/L — AB (ref 0–37)
Alkaline Phosphatase: 71 U/L (ref 39–117)
BUN: 10 mg/dL (ref 6–23)
CALCIUM: 9.5 mg/dL (ref 8.4–10.5)
CHLORIDE: 103 meq/L (ref 96–112)
CO2: 28 mEq/L (ref 19–32)
Creat: 0.8 mg/dL (ref 0.50–1.35)
Glucose, Bld: 115 mg/dL — ABNORMAL HIGH (ref 70–99)
POTASSIUM: 3.8 meq/L (ref 3.5–5.3)
Sodium: 139 mEq/L (ref 135–145)
Total Bilirubin: 0.7 mg/dL (ref 0.2–1.2)
Total Protein: 6.9 g/dL (ref 6.0–8.3)

## 2014-01-03 NOTE — Telephone Encounter (Signed)
Thanks

## 2014-01-03 NOTE — Telephone Encounter (Signed)
Per Dr. Ninetta LightsHatcher patient informed that his CBC and CMP labs were fine. Wendall MolaJacqueline Cockerham

## 2014-01-04 LAB — HEMOGLOBIN A1C
HEMOGLOBIN A1C: 6.3 % — AB (ref <5.7)
MEAN PLASMA GLUCOSE: 134 mg/dL — AB (ref <117)

## 2014-01-04 NOTE — Telephone Encounter (Signed)
Confirmed stop date for Cefepime/vancomycin of 01/11/14 with [facility], as requested by Altamese Cabalheryl Vaughn [facility].

## 2014-03-14 ENCOUNTER — Encounter: Payer: Self-pay | Admitting: Infectious Diseases

## 2014-03-14 ENCOUNTER — Ambulatory Visit (INDEPENDENT_AMBULATORY_CARE_PROVIDER_SITE_OTHER): Payer: Worker's Compensation | Admitting: Infectious Diseases

## 2014-03-14 VITALS — BP 146/90 | HR 80 | Temp 97.9°F | Wt 264.0 lb

## 2014-03-14 DIAGNOSIS — T8189XD Other complications of procedures, not elsewhere classified, subsequent encounter: Secondary | ICD-10-CM

## 2014-03-14 DIAGNOSIS — M869 Osteomyelitis, unspecified: Secondary | ICD-10-CM | POA: Insufficient documentation

## 2014-03-14 MED ORDER — SULFAMETHOXAZOLE-TRIMETHOPRIM 800-160 MG PO TABS: 1.0000 | ORAL_TABLET | Freq: Two times a day (BID) | ORAL | Status: AC

## 2014-03-14 NOTE — Progress Notes (Signed)
   Subjective:    Patient ID: Parker Bond, male    DOB: 01/07/1956, 58 y.o.   MRN: [id number]  HPI 58 yo M with hx of fall March 2015 with resulting L ankle fracture. He had external fixateur placed as well as pins. This was replaced with plates July 11, 6617.  Since he had persistent drainage from his wounds, mostly from his medial ankle. He was on keflex for 3 weeks then presented 11-30-13 and had removal of most (but not all) of his hardware. There was a slime layer noted in the OR. ESR 7. CRP <05 He was started on cefepime/vanco. His Cx were negative and he was d/c home on 9-8 on cefepime/vanco. He completed this 01-11-14. His course was complicated by fungal rash in his groin as well as a stitch abscess. He was treated with diflucan for 2 weeks.  He was then transitioned to bactrim for his osteomyelitis.    He had poor wound healing and was taken back to OR on 12-8 and had closure of his wound and removal of the rest of his hardware. No further wound d/c. No fever or chills. Wt bearing has been zero since surgery. Mild swelling still. He is off anbx for ~ 1 month.    Review of Systems     Objective:   Physical Exam  Constitutional: He appears well-developed and well-nourished.  Musculoskeletal:       Legs:         Assessment & Plan:

## 2014-03-14 NOTE — Assessment & Plan Note (Signed)
He is doing much better. Will restart his bactrim. Aim for 5 more months (shorter course now that his hardware is out). His CRP was elevated prior, his esr wast not. Will see him back in 4 months.

## 2014-03-19 ENCOUNTER — Encounter: Payer: Self-pay | Admitting: Infectious Diseases

## 2014-07-16 ENCOUNTER — Ambulatory Visit (INDEPENDENT_AMBULATORY_CARE_PROVIDER_SITE_OTHER): Payer: Worker's Compensation | Admitting: Infectious Diseases

## 2014-07-16 ENCOUNTER — Encounter: Payer: Self-pay | Admitting: Infectious Diseases

## 2014-07-16 VITALS — BP 121/73 | HR 98 | Temp 97.7°F | Ht 72.0 in | Wt 262.0 lb

## 2014-07-16 DIAGNOSIS — M869 Osteomyelitis, unspecified: Secondary | ICD-10-CM | POA: Diagnosis not present

## 2014-07-16 LAB — C-REACTIVE PROTEIN: CRP: <0.5 mg/dL (ref <0.60)

## 2014-07-16 LAB — SEDIMENTATION RATE: SED RATE: 5 mm/h (ref 0–20)

## 2014-07-16 NOTE — Assessment & Plan Note (Signed)
He appears to be doing well.  He asks that I communicate with Dr Doran Durand (I called and left a voice mail).  Would continue his bactrim at this point, for 6 weeks after his fusion. I have no current evidence of infection (will recheck his ESR and CRP). Hopefully his surgery will be altered and Cx re-sent if there are active signs of infection.  Will see him back in 6 weeks.

## 2014-07-16 NOTE — Pre-Procedure Instructions (Signed)
Parker Bond  07/16/2014   Your procedure is scheduled on:  July 19, 2014  [facility] at 5:30 AM.  Call this number if you have problems the morning of surgery: [phone]   Remember:   Do not eat food or drink liquids after midnight.   Take these medicines the morning of surgery with A SIP OF WATER: acetaminophen (TYLENOL), gabapentin (NEURONTIN) , levothyroxine (SYNTHROID),omeprazole  (PRILOSEC) , sulfamethoxazole-trimethoprim (BACTRIM)   STOP ASPIRIN, NSAIDS (ADVIL, ALEVE, IBUPROFEN) HERBAL SUPPLEMENTS AS OF TODAY   Do not wear jewelry.  Do not wear lotions, powders, or colognes. You may wear deodorant.  Men may shave face and neck.  Do not bring valuables to the hospital.  [facility] is not responsible                  for any belongings or valuables.               Contacts, dentures or bridgework may not be worn into surgery.  Leave suitcase in the car. After surgery it may be brought to your room.  For patients admitted to the hospital, discharge time is determined by your                treatment team.               Patients discharged the day of surgery will not be allowed to drive  home.  Name and phone number of your driver: famly/friend  Special Instructions: "PREPARING FOR SURGERY"   Please read over the following fact sheets that you were given: Pain Booklet, Coughing and Deep Breathing and Surgical Site Infection Prevention

## 2014-07-16 NOTE — Progress Notes (Signed)
   Subjective:    Patient ID: Parker Bond, male    DOB: 1955/09/07, 59 y.o.   MRN: [id number]  HPI 59 yo M with hx of fall March 2015 with resulting L ankle fracture. He had external fixateur placed as well as pins. This was replaced with plates July 12, 5187.  Since he had persistent drainage from his wounds, mostly from his medial ankle. He was on keflex for 3 weeks then presented 11-30-13 and had removal of most (but not all) of his hardware. There was a slime layer noted in the OR. ESR 7. CRP <05 He was started on cefepime/vanco. His Cx were negative and he was d/c home on 9-8 on cefepime/vanco. He completed this 01-11-14. His course was complicated by fungal rash in his groin as well as a stitch abscess. He was treated with diflucan for 2 weeks.  He was then transitioned to bactrim for his osteomyelitis.   He had poor wound healing and was taken back to OR on 12-8 and had closure of his wound and removal of the rest of his hardware. He was seen in ID f/u on 12-18. Was off anbx. He was restarted on bactrim with plan for 5 months.   He is going to surgery on 4-21 for ankle fusion. States his bones are no longer connected. He has still on bactrim. He is non-wt bearing.  No f/c.   Review of Systems  Constitutional: Negative for fever and chills.       Objective:   Physical Exam  Constitutional: He appears well-developed and well-nourished.  Musculoskeletal: He exhibits no edema.       Feet:          Assessment & Plan:

## 2014-07-17 ENCOUNTER — Encounter (HOSPITAL_COMMUNITY): Payer: Self-pay

## 2014-07-17 ENCOUNTER — Encounter (HOSPITAL_COMMUNITY)
Admission: RE | Admit: 2014-07-17 | Discharge: 2014-07-17 | Disposition: A | Discharge status: Home / Self Care | Payer: Worker's Compensation | Source: Ambulatory Visit | Attending: Orthopedic Surgery | Admitting: Orthopedic Surgery

## 2014-07-17 ENCOUNTER — Other Ambulatory Visit (HOSPITAL_COMMUNITY): Payer: Self-pay

## 2014-07-17 DIAGNOSIS — M869 Osteomyelitis, unspecified: Secondary | ICD-10-CM | POA: Insufficient documentation

## 2014-07-17 DIAGNOSIS — Z01812 Encounter for preprocedural laboratory examination: Secondary | ICD-10-CM | POA: Diagnosis present

## 2014-07-17 HISTORY — DX: Hypothyroidism, unspecified: E03.9

## 2014-07-17 HISTORY — DX: Unspecified asthma, uncomplicated: J45.909

## 2014-07-17 HISTORY — DX: Pneumonia, unspecified organism: J18.9

## 2014-07-17 HISTORY — DX: Type 2 diabetes mellitus without complications: E11.9

## 2014-07-17 LAB — SURGICAL PCR SCREEN
MRSA, PCR: NEGATIVE
STAPHYLOCOCCUS AUREUS: NEGATIVE

## 2014-07-17 LAB — BASIC METABOLIC PANEL
Anion gap: 9 (ref 5–15)
BUN: 13 mg/dL (ref 6–23)
CALCIUM: 9.3 mg/dL (ref 8.4–10.5)
CO2: 24 mmol/L (ref 19–32)
Chloride: 101 mmol/L (ref 96–112)
Creatinine, Ser: 1.18 mg/dL (ref 0.50–1.35)
GFR calc Af Amer: 75 mL/min — ABNORMAL LOW (ref >90)
GFR, EST NON AFRICAN AMERICAN: 65 mL/min — AB (ref >90)
GLUCOSE: 128 mg/dL — AB (ref 70–99)
Potassium: 4 mmol/L (ref 3.5–5.1)
Sodium: 134 mmol/L — ABNORMAL LOW (ref 135–145)

## 2014-07-17 LAB — CBC
HCT: 41.4 % (ref 39.0–52.0)
Hemoglobin: 13.4 g/dL (ref 13.0–17.0)
MCH: 27.8 pg (ref 26.0–34.0)
MCHC: 32.4 g/dL (ref 30.0–36.0)
MCV: 85.9 fL (ref 78.0–100.0)
PLATELETS: 249 10*3/uL (ref 150–400)
RBC: 4.82 MIL/uL (ref 4.22–5.81)
RDW: 16.3 % — AB (ref 11.5–15.5)
WBC: 3.9 10*3/uL — ABNORMAL LOW (ref 4.0–10.5)

## 2014-07-17 NOTE — Progress Notes (Signed)
Pt has not shown for 0900 PAT, Left voicemail at pt home number

## 2014-07-17 NOTE — Pre-Procedure Instructions (Signed)
Parker Bond  07/17/2014   Your procedure is scheduled on:  Thursday, July 19, 2014 at 7:30 AM.   [facility] Entr[facility] "A" Admitting Office at 5:30 AM.   Call this number if you have problems the morning of surgery: [phone]   Remember:   Do not eat food or drink liquids after midnight Wednesday, 07/18/14.   Take these medicines the morning of surgery with A SIP OF WATER: Citalopram (Celexa), Gabapentin (Neurontin), Levothyroxine (Synthroid), Omeprazole (Prilosec), Flonase, Hydrocodone - if needed.  Stop Aspirin and Vitamins as of today if not already done so.    Do not wear jewelry.  Do not wear lotions, powders, or cologne. You may wear deodorant.  Men may shave face and neck.  Do not bring valuables to the hospital.  [facility] is not responsible                  for any belongings or valuables.               Contacts, dentures or bridgework may not be worn into surgery.  Leave suitcase in the car. After surgery it may be brought to your room.  For patients admitted to the hospital, discharge time is determined by your                treatment team.                            Special Instructions: [facility] - Preparing for Surgery  Before surgery, you can play an important role.  Because skin is not sterile, your skin needs to be as free of germs as possible.  You can reduce the number of germs on you skin by washing with CHG (chlorahexidine gluconate) soap before surgery.  CHG is an antiseptic cleaner which kills germs and bonds with the skin to continue killing germs even after washing.  Please DO NOT use if you have an allergy to CHG or antibacterial soaps.  If your skin becomes reddened/irritated stop using the CHG and inform your nurse when you arrive at Short Stay.  Do not shave (including legs and underarms) for at least 48 hours prior to the first CHG shower.  You may shave your face.  Please follow these instructions carefully:   1.  Shower  with CHG Soap the night before surgery and the                                morning of Surgery.  2.  If you choose to wash your hair, wash your hair first as usual with your       normal shampoo.  3.  After you shampoo, rinse your hair and body thoroughly to remove the                      Shampoo.  4.  Use CHG as you would any other liquid soap.  You can apply chg directly       to the skin and wash gently with scrungie or a clean washcloth.  5.  Apply the CHG Soap to your body ONLY FROM THE NECK DOWN.        Do not use on open wounds or open sores.  Avoid contact with your eyes, ears, mouth and genitals (private parts).  Wash genitals (private parts) with your  normal soap.  6.  Wash thoroughly, paying special attention to the area where your surgery        will be performed.  7.  Thoroughly rinse your body with warm water from the neck down.  8.  DO NOT shower/wash with your normal soap after using and rinsing off       the CHG Soap.  9.  Pat yourself dry with a clean towel.            10.  Wear clean pajamas.            11.  Place clean sheets on your bed the night of your first shower and do not        sleep with pets.  Day of Surgery  Do not apply any lotions the morning of surgery.  Please wear clean clothes to the hospital.     Please read over the following fact sheets that you were given: Pain Booklet, Coughing and Deep Breathing, MRSA Information and Surgical Site Infection Prevention

## 2014-07-17 NOTE — Progress Notes (Signed)
No pre surgical orders, voicemail left with Toniann FailWendy at office.

## 2014-07-19 ENCOUNTER — Inpatient Hospital Stay (HOSPITAL_COMMUNITY): Payer: Worker's Compensation | Admitting: Certified Registered"

## 2014-07-19 ENCOUNTER — Encounter (HOSPITAL_COMMUNITY)
Admission: RE | Disposition: A | Discharge status: Home / Self Care | Payer: Self-pay | Source: Ambulatory Visit | Attending: Orthopedic Surgery

## 2014-07-19 ENCOUNTER — Encounter (HOSPITAL_COMMUNITY): Payer: Self-pay | Admitting: *Deleted

## 2014-07-19 ENCOUNTER — Inpatient Hospital Stay (HOSPITAL_COMMUNITY)
Admission: RE | Admit: 2014-07-19 | Discharge: 2014-07-20 | DRG: 494 | Disposition: A | Discharge status: Home / Self Care | Payer: Worker's Compensation | Source: Ambulatory Visit | Attending: Orthopedic Surgery | Admitting: Orthopedic Surgery

## 2014-07-19 DIAGNOSIS — Z79899 Other long term (current) drug therapy: Secondary | ICD-10-CM | POA: Diagnosis not present

## 2014-07-19 DIAGNOSIS — K219 Gastro-esophageal reflux disease without esophagitis: Secondary | ICD-10-CM | POA: Diagnosis present

## 2014-07-19 DIAGNOSIS — J45909 Unspecified asthma, uncomplicated: Secondary | ICD-10-CM | POA: Diagnosis present

## 2014-07-19 DIAGNOSIS — E119 Type 2 diabetes mellitus without complications: Secondary | ICD-10-CM | POA: Diagnosis present

## 2014-07-19 DIAGNOSIS — E785 Hyperlipidemia, unspecified: Secondary | ICD-10-CM | POA: Diagnosis present

## 2014-07-19 DIAGNOSIS — M25572 Pain in left ankle and joints of left foot: Secondary | ICD-10-CM | POA: Diagnosis present

## 2014-07-19 DIAGNOSIS — M6702 Short Achilles tendon (acquired), left ankle: Secondary | ICD-10-CM | POA: Diagnosis present

## 2014-07-19 DIAGNOSIS — E039 Hypothyroidism, unspecified: Secondary | ICD-10-CM | POA: Diagnosis present

## 2014-07-19 DIAGNOSIS — S82872K Displaced pilon fracture of left tibia, subsequent encounter for closed fracture with nonunion: Secondary | ICD-10-CM | POA: Diagnosis not present

## 2014-07-19 DIAGNOSIS — F329 Major depressive disorder, single episode, unspecified: Secondary | ICD-10-CM | POA: Diagnosis present

## 2014-07-19 DIAGNOSIS — W19XXXD Unspecified fall, subsequent encounter: Secondary | ICD-10-CM | POA: Diagnosis present

## 2014-07-19 DIAGNOSIS — G4733 Obstructive sleep apnea (adult) (pediatric): Secondary | ICD-10-CM | POA: Diagnosis present

## 2014-07-19 DIAGNOSIS — F419 Anxiety disorder, unspecified: Secondary | ICD-10-CM | POA: Diagnosis present

## 2014-07-19 DIAGNOSIS — Z87891 Personal history of nicotine dependence: Secondary | ICD-10-CM | POA: Diagnosis not present

## 2014-07-19 DIAGNOSIS — Z7982 Long term (current) use of aspirin: Secondary | ICD-10-CM | POA: Diagnosis not present

## 2014-07-19 DIAGNOSIS — Z23 Encounter for immunization: Secondary | ICD-10-CM

## 2014-07-19 DIAGNOSIS — I10 Essential (primary) hypertension: Secondary | ICD-10-CM | POA: Diagnosis present

## 2014-07-19 DIAGNOSIS — S82209A Unspecified fracture of shaft of unspecified tibia, initial encounter for closed fracture: Secondary | ICD-10-CM | POA: Diagnosis present

## 2014-07-19 DIAGNOSIS — M1250 Traumatic arthropathy, unspecified site: Secondary | ICD-10-CM | POA: Diagnosis present

## 2014-07-19 HISTORY — PX: ANKLE FUSION: SHX881

## 2014-07-19 HISTORY — PX: OTHER SURGICAL HISTORY: SHX169

## 2014-07-19 HISTORY — DX: Unspecified osteoarthritis, unspecified site: M19.90

## 2014-07-19 LAB — GLUCOSE, CAPILLARY
Glucose-Capillary: 150 mg/dL — ABNORMAL HIGH (ref 70–99)
Glucose-Capillary: 168 mg/dL — ABNORMAL HIGH (ref 70–99)
Glucose-Capillary: 147 mg/dL — ABNORMAL HIGH (ref 70–99)
Glucose-Capillary: 142 mg/dL — ABNORMAL HIGH (ref 70–99)

## 2014-07-19 SURGERY — ARTHRODESIS ANKLE
Anesthesia: General | Laterality: Left

## 2014-07-19 MED ORDER — PHENYLEPHRINE HCL 10 MG/ML IJ SOLN
INTRAMUSCULAR | Status: DC | PRN
  Administered 2014-07-19 (×9): 40 ug via INTRAVENOUS
  Administered 2014-07-19: 80 ug via INTRAVENOUS
  Administered 2014-07-19 (×11): 40 ug via INTRAVENOUS
  Administered 2014-07-19: 80 ug via INTRAVENOUS
  Administered 2014-07-19 (×4): 40 ug via INTRAVENOUS

## 2014-07-19 MED ORDER — DIPHENHYDRAMINE HCL 25 MG PO CAPS
25.0000 mg | ORAL_CAPSULE | Freq: Four times a day (QID) | ORAL | Status: DC | PRN
  Administered 2014-07-19 – 2014-07-20 (×2): 25 mg via ORAL
  Filled 2014-07-19: qty 2
  Filled 2014-07-19: qty 1

## 2014-07-19 MED ORDER — PHENYLEPHRINE 40 MCG/ML (10ML) SYRINGE FOR IV PUSH (FOR BLOOD PRESSURE SUPPORT)
PREFILLED_SYRINGE | INTRAVENOUS | Status: AC
  Filled 2014-07-19: qty 10

## 2014-07-19 MED ORDER — ONDANSETRON HCL 4 MG/2ML IJ SOLN: 4.0000 mg | Freq: Once | INTRAMUSCULAR | Status: DC | PRN

## 2014-07-19 MED ORDER — SENNA 8.6 MG PO TABS
1.0000 | ORAL_TABLET | Freq: Two times a day (BID) | ORAL | Status: DC
  Administered 2014-07-19 – 2014-07-20 (×3): 8.6 mg via ORAL
  Filled 2014-07-19 (×3): qty 1

## 2014-07-19 MED ORDER — FENTANYL CITRATE (PF) 100 MCG/2ML IJ SOLN
INTRAMUSCULAR | Status: DC | PRN
  Administered 2014-07-19: 25 ug via INTRAVENOUS
  Administered 2014-07-19: 100 ug via INTRAVENOUS
  Administered 2014-07-19 (×3): 25 ug via INTRAVENOUS

## 2014-07-19 MED ORDER — PROPOFOL 10 MG/ML IV BOLUS
INTRAVENOUS | Status: AC
  Filled 2014-07-19: qty 20

## 2014-07-19 MED ORDER — OXYCODONE HCL 5 MG PO TABS
5.0000 mg | ORAL_TABLET | ORAL | Status: DC | PRN
  Administered 2014-07-19 – 2014-07-20 (×5): 10 mg via ORAL
  Filled 2014-07-19 (×5): qty 2

## 2014-07-19 MED ORDER — CELECOXIB 200 MG PO CAPS
200.0000 mg | ORAL_CAPSULE | Freq: Two times a day (BID) | ORAL | Status: DC
  Administered 2014-07-19 – 2014-07-20 (×3): 200 mg via ORAL
  Filled 2014-07-19 (×3): qty 1

## 2014-07-19 MED ORDER — GABAPENTIN 400 MG PO CAPS
400.0000 mg | ORAL_CAPSULE | Freq: Two times a day (BID) | ORAL | Status: DC
  Administered 2014-07-19 – 2014-07-20 (×2): 400 mg via ORAL
  Filled 2014-07-19 (×3): qty 1

## 2014-07-19 MED ORDER — SODIUM CHLORIDE 0.9 % IV SOLN: INTRAVENOUS | Status: DC

## 2014-07-19 MED ORDER — VANCOMYCIN HCL 10 G IV SOLR
1500.0000 mg | INTRAVENOUS | Status: AC
  Administered 2014-07-19: 1500 mg via INTRAVENOUS
  Filled 2014-07-19: qty 1500

## 2014-07-19 MED ORDER — HYDROMORPHONE HCL 1 MG/ML IJ SOLN: 0.2500 mg | INTRAMUSCULAR | Status: DC | PRN

## 2014-07-19 MED ORDER — ACETAMINOPHEN 500 MG PO TABS
1000.0000 mg | ORAL_TABLET | Freq: Four times a day (QID) | ORAL | Status: AC
  Administered 2014-07-19 – 2014-07-20 (×4): 1000 mg via ORAL
  Filled 2014-07-19 (×4): qty 2

## 2014-07-19 MED ORDER — ONDANSETRON HCL 4 MG/2ML IJ SOLN: 4.0000 mg | Freq: Four times a day (QID) | INTRAMUSCULAR | Status: DC | PRN

## 2014-07-19 MED ORDER — LACTATED RINGERS IV SOLN
INTRAVENOUS | Status: DC | PRN
  Administered 2014-07-19 (×2): via INTRAVENOUS

## 2014-07-19 MED ORDER — MEPERIDINE HCL 25 MG/ML IJ SOLN: 6.2500 mg | INTRAMUSCULAR | Status: DC | PRN

## 2014-07-19 MED ORDER — LIDOCAINE HCL (CARDIAC) 20 MG/ML IV SOLN
INTRAVENOUS | Status: AC
  Filled 2014-07-19: qty 5

## 2014-07-19 MED ORDER — ONDANSETRON HCL 4 MG PO TABS: 4.0000 mg | ORAL_TABLET | Freq: Four times a day (QID) | ORAL | Status: DC | PRN

## 2014-07-19 MED ORDER — HYDROCHLOROTHIAZIDE 25 MG PO TABS
25.0000 mg | ORAL_TABLET | Freq: Every day | ORAL | Status: DC
  Administered 2014-07-19 – 2014-07-20 (×2): 25 mg via ORAL
  Filled 2014-07-19 (×2): qty 1

## 2014-07-19 MED ORDER — METOCLOPRAMIDE HCL 5 MG/ML IJ SOLN: 5.0000 mg | Freq: Three times a day (TID) | INTRAMUSCULAR | Status: DC | PRN

## 2014-07-19 MED ORDER — PNEUMOCOCCAL VAC POLYVALENT 25 MCG/0.5ML IJ INJ
0.5000 mL | INJECTION | INTRAMUSCULAR | Status: AC
Start: 2014-07-20 — End: 2014-07-20
  Administered 2014-07-20: 0.5 mL via INTRAMUSCULAR
  Filled 2014-07-19: qty 0.5

## 2014-07-19 MED ORDER — POTASSIUM CHLORIDE CRYS ER 10 MEQ PO TBCR
10.0000 meq | EXTENDED_RELEASE_TABLET | Freq: Every day | ORAL | Status: DC
  Administered 2014-07-19 – 2014-07-20 (×2): 10 meq via ORAL
  Filled 2014-07-19 (×2): qty 1

## 2014-07-19 MED ORDER — VANCOMYCIN HCL IN DEXTROSE 1-5 GM/200ML-% IV SOLN
1000.0000 mg | Freq: Two times a day (BID) | INTRAVENOUS | Status: AC
  Administered 2014-07-19: 1000 mg via INTRAVENOUS
  Filled 2014-07-19: qty 200

## 2014-07-19 MED ORDER — METFORMIN HCL 500 MG PO TABS
500.0000 mg | ORAL_TABLET | Freq: Every day | ORAL | Status: DC
  Administered 2014-07-19: 500 mg via ORAL
  Filled 2014-07-19: qty 1

## 2014-07-19 MED ORDER — MIDAZOLAM HCL 2 MG/2ML IJ SOLN
INTRAMUSCULAR | Status: AC
  Filled 2014-07-19: qty 2

## 2014-07-19 MED ORDER — LISINOPRIL-HYDROCHLOROTHIAZIDE 20-25 MG PO TABS
1.0000 | ORAL_TABLET | Freq: Every day | ORAL | Status: DC
Start: 2014-07-19 — End: 2014-07-19

## 2014-07-19 MED ORDER — EPHEDRINE SULFATE 50 MG/ML IJ SOLN
INTRAMUSCULAR | Status: DC | PRN
  Administered 2014-07-19 (×6): 5 mg via INTRAVENOUS

## 2014-07-19 MED ORDER — DOCUSATE SODIUM 100 MG PO CAPS
100.0000 mg | ORAL_CAPSULE | Freq: Two times a day (BID) | ORAL | Status: DC
  Administered 2014-07-19 – 2014-07-20 (×3): 100 mg via ORAL
  Filled 2014-07-19 (×3): qty 1

## 2014-07-19 MED ORDER — FLUTICASONE PROPIONATE 50 MCG/ACT NA SUSP
1.0000 | Freq: Two times a day (BID) | NASAL | Status: DC
  Administered 2014-07-19 – 2014-07-20 (×2): 1 via NASAL
  Filled 2014-07-19: qty 16

## 2014-07-19 MED ORDER — CITALOPRAM HYDROBROMIDE 10 MG PO TABS
20.0000 mg | ORAL_TABLET | Freq: Every day | ORAL | Status: DC
  Administered 2014-07-20: 20 mg via ORAL
  Filled 2014-07-19 (×2): qty 2

## 2014-07-19 MED ORDER — PANTOPRAZOLE SODIUM 40 MG PO TBEC
80.0000 mg | DELAYED_RELEASE_TABLET | Freq: Every day | ORAL | Status: DC
  Administered 2014-07-20: 80 mg via ORAL
  Filled 2014-07-19 (×2): qty 2

## 2014-07-19 MED ORDER — GLYCOPYRROLATE 0.2 MG/ML IJ SOLN
INTRAMUSCULAR | Status: AC
  Filled 2014-07-19: qty 4

## 2014-07-19 MED ORDER — PRAVASTATIN SODIUM 20 MG PO TABS
20.0000 mg | ORAL_TABLET | Freq: Every day | ORAL | Status: DC
  Administered 2014-07-19: 20 mg via ORAL
  Filled 2014-07-19: qty 1

## 2014-07-19 MED ORDER — ASPIRIN EC 325 MG PO TBEC
325.0000 mg | DELAYED_RELEASE_TABLET | Freq: Every day | ORAL | Status: DC
  Administered 2014-07-19 – 2014-07-20 (×2): 325 mg via ORAL
  Filled 2014-07-19 (×2): qty 1

## 2014-07-19 MED ORDER — BUPIVACAINE HCL (PF) 0.5 % IJ SOLN
INTRAMUSCULAR | Status: AC
  Filled 2014-07-19: qty 10

## 2014-07-19 MED ORDER — LEVOTHYROXINE SODIUM 50 MCG PO TABS
75.0000 ug | ORAL_TABLET | Freq: Every day | ORAL | Status: DC
  Administered 2014-07-20: 75 ug via ORAL
  Filled 2014-07-19 (×4): qty 1

## 2014-07-19 MED ORDER — METOCLOPRAMIDE HCL 5 MG PO TABS: 5.0000 mg | ORAL_TABLET | Freq: Three times a day (TID) | ORAL | Status: DC | PRN

## 2014-07-19 MED ORDER — ROCURONIUM BROMIDE 50 MG/5ML IV SOLN
INTRAVENOUS | Status: AC
  Filled 2014-07-19: qty 1

## 2014-07-19 MED ORDER — LIDOCAINE-EPINEPHRINE (PF) 1.5 %-1:200000 IJ SOLN
INTRAMUSCULAR | Status: DC | PRN
  Administered 2014-07-19: 30 mL via PERINEURAL

## 2014-07-19 MED ORDER — INSULIN ASPART 100 UNIT/ML ~~LOC~~ SOLN
0.0000 [IU] | Freq: Three times a day (TID) | SUBCUTANEOUS | Status: DC
  Administered 2014-07-19: 2 [IU] via SUBCUTANEOUS
  Administered 2014-07-20 (×2): 3 [IU] via SUBCUTANEOUS

## 2014-07-19 MED ORDER — CHLORHEXIDINE GLUCONATE 4 % EX LIQD: 60.0000 mL | Freq: Once | CUTANEOUS | Status: DC

## 2014-07-19 MED ORDER — BUPIVACAINE-EPINEPHRINE (PF) 0.5% -1:200000 IJ SOLN
INTRAMUSCULAR | Status: DC | PRN
  Administered 2014-07-19: 30 mL via PERINEURAL

## 2014-07-19 MED ORDER — ONDANSETRON HCL 4 MG/2ML IJ SOLN
INTRAMUSCULAR | Status: AC
  Filled 2014-07-19: qty 2

## 2014-07-19 MED ORDER — LISINOPRIL 20 MG PO TABS
20.0000 mg | ORAL_TABLET | Freq: Every day | ORAL | Status: DC
  Administered 2014-07-19 – 2014-07-20 (×2): 20 mg via ORAL
  Filled 2014-07-19 (×2): qty 1

## 2014-07-19 MED ORDER — ARTIFICIAL TEARS OP OINT
TOPICAL_OINTMENT | OPHTHALMIC | Status: DC | PRN
  Administered 2014-07-19: 1 via OPHTHALMIC

## 2014-07-19 MED ORDER — FENTANYL CITRATE (PF) 250 MCG/5ML IJ SOLN
INTRAMUSCULAR | Status: AC
  Filled 2014-07-19: qty 5

## 2014-07-19 MED ORDER — ONDANSETRON HCL 4 MG/2ML IJ SOLN
INTRAMUSCULAR | Status: DC | PRN
  Administered 2014-07-19: 4 mg via INTRAVENOUS

## 2014-07-19 MED ORDER — BACITRACIN ZINC 500 UNIT/GM EX OINT
TOPICAL_OINTMENT | CUTANEOUS | Status: DC | PRN
  Administered 2014-07-19: 1 via TOPICAL

## 2014-07-19 MED ORDER — VANCOMYCIN HCL 500 MG IV SOLR
INTRAVENOUS | Status: AC
  Filled 2014-07-19: qty 500

## 2014-07-19 MED ORDER — NEOSTIGMINE METHYLSULFATE 10 MG/10ML IV SOLN
INTRAVENOUS | Status: AC
  Filled 2014-07-19: qty 1

## 2014-07-19 MED ORDER — LIDOCAINE HCL (CARDIAC) 20 MG/ML IV SOLN
INTRAVENOUS | Status: DC | PRN
  Administered 2014-07-19: 40 mg via INTRAVENOUS

## 2014-07-19 MED ORDER — PROPOFOL 10 MG/ML IV BOLUS
INTRAVENOUS | Status: DC | PRN
  Administered 2014-07-19: 30 mg via INTRAVENOUS
  Administered 2014-07-19: 20 mg via INTRAVENOUS
  Administered 2014-07-19: 50 mg via INTRAVENOUS
  Administered 2014-07-19: 90 mg via INTRAVENOUS
  Administered 2014-07-19 (×2): 20 mg via INTRAVENOUS

## 2014-07-19 MED ORDER — PHENYLEPHRINE 40 MCG/ML (10ML) SYRINGE FOR IV PUSH (FOR BLOOD PRESSURE SUPPORT)
PREFILLED_SYRINGE | INTRAVENOUS | Status: AC
  Filled 2014-07-19: qty 20

## 2014-07-19 MED ORDER — BACITRACIN ZINC 500 UNIT/GM EX OINT
TOPICAL_OINTMENT | CUTANEOUS | Status: AC
  Filled 2014-07-19: qty 28.35

## 2014-07-19 MED ORDER — SODIUM CHLORIDE 0.9 % IR SOLN
Status: DC | PRN
  Administered 2014-07-19: 1

## 2014-07-19 MED ORDER — MIDAZOLAM HCL 5 MG/5ML IJ SOLN
INTRAMUSCULAR | Status: DC | PRN
  Administered 2014-07-19: 2 mg via INTRAVENOUS

## 2014-07-19 MED ORDER — ARTIFICIAL TEARS OP OINT
TOPICAL_OINTMENT | OPHTHALMIC | Status: AC
  Filled 2014-07-19: qty 3.5

## 2014-07-19 MED ORDER — SODIUM CHLORIDE 0.9 % IV SOLN
INTRAVENOUS | Status: DC
  Administered 2014-07-19: 15:00:00 via INTRAVENOUS

## 2014-07-19 SURGICAL SUPPLY — 84 items
BANDAGE ESMARK 6X9 LF (GAUZE/BANDAGES/DRESSINGS) ×1 IMPLANT
BIT DRILL 2.5X2.75 QC CALB (BIT) ×3 IMPLANT
BIT DRILL 4.8X200 CANN (BIT) ×6 IMPLANT
BIT DRILL CALIBRATED 2.7 (BIT) ×4 IMPLANT
BIT DRILL CALIBRATED 2.7MM (BIT) ×2
BLADE SAW SGTL HD 18.5X60.5X1. (BLADE) ×3 IMPLANT
BLADE SURG 10 STRL SS (BLADE) ×3 IMPLANT
BNDG COHESIVE 4X5 TAN STRL (GAUZE/BANDAGES/DRESSINGS) ×3 IMPLANT
BNDG COHESIVE 6X5 TAN STRL LF (GAUZE/BANDAGES/DRESSINGS) ×3 IMPLANT
BNDG ESMARK 6X9 LF (GAUZE/BANDAGES/DRESSINGS) ×3
CANISTER SUCT 3000ML PPV (MISCELLANEOUS) ×3 IMPLANT
CHLORAPREP W/TINT 10.5 ML (MISCELLANEOUS) ×3 IMPLANT
CHLORAPREP W/TINT 26ML (MISCELLANEOUS) ×3 IMPLANT
CLOSURE WOUND 1/2 X4 (GAUZE/BANDAGES/DRESSINGS) ×1
COVER SURGICAL LIGHT HANDLE (MISCELLANEOUS) ×3 IMPLANT
CUFF TOURNIQUET SINGLE 34IN LL (TOURNIQUET CUFF) ×3 IMPLANT
CUFF TOURNIQUET SINGLE 44IN (TOURNIQUET CUFF) IMPLANT
DRAPE C-ARM 42X72 X-RAY (DRAPES) ×3 IMPLANT
DRAPE C-ARMOR (DRAPES) ×3 IMPLANT
DRAPE INCISE IOBAN 66X45 STRL (DRAPES) ×3 IMPLANT
DRAPE U-SHAPE 47X51 STRL (DRAPES) ×3 IMPLANT
DRSG ADAPTIC 3X8 NADH LF (GAUZE/BANDAGES/DRESSINGS) ×3 IMPLANT
DRSG PAD ABDOMINAL 8X10 ST (GAUZE/BANDAGES/DRESSINGS) ×3 IMPLANT
DRSG TEGADERM 4X4.75 (GAUZE/BANDAGES/DRESSINGS) ×3 IMPLANT
ELECT REM PT RETURN 9FT ADLT (ELECTROSURGICAL) ×3
ELECTRODE REM PT RTRN 9FT ADLT (ELECTROSURGICAL) ×1 IMPLANT
GAUZE SPONGE 2X2 8PLY STRL LF (GAUZE/BANDAGES/DRESSINGS) ×1 IMPLANT
GAUZE SPONGE 4X4 12PLY STRL (GAUZE/BANDAGES/DRESSINGS) IMPLANT
GLOVE BIO SURGEON STRL SZ7 (GLOVE) ×12 IMPLANT
GLOVE BIO SURGEON STRL SZ8 (GLOVE) ×9 IMPLANT
GLOVE BIOGEL PI IND STRL 8 (GLOVE) ×1 IMPLANT
GLOVE BIOGEL PI INDICATOR 8 (GLOVE) ×2
GOWN STRL REUS W/ TWL LRG LVL3 (GOWN DISPOSABLE) ×2 IMPLANT
GOWN STRL REUS W/ TWL XL LVL3 (GOWN DISPOSABLE) ×1 IMPLANT
GOWN STRL REUS W/TWL LRG LVL3 (GOWN DISPOSABLE) ×4
GOWN STRL REUS W/TWL XL LVL3 (GOWN DISPOSABLE) ×2
GUIDE PIN ×3 IMPLANT
HARVESTER BONE 10MM SHORT (INSTRUMENTS) ×3 IMPLANT
K-WIRE ACE 1.6X6 (WIRE) ×12
KIT BASIN OR (CUSTOM PROCEDURE TRAY) ×3 IMPLANT
KIT ROOM TURNOVER OR (KITS) ×3 IMPLANT
KWIRE ACE 1.6X6 (WIRE) ×4 IMPLANT
NEEDLE 22X1 1/2 (OR ONLY) (NEEDLE) IMPLANT
NS IRRIG 1000ML POUR BTL (IV SOLUTION) ×3 IMPLANT
PACK ORTHO EXTREMITY (CUSTOM PROCEDURE TRAY) ×3 IMPLANT
PAD ARMBOARD 7.5X6 YLW CONV (MISCELLANEOUS) ×6 IMPLANT
PAD CAST 3X4 CTTN HI CHSV (CAST SUPPLIES) ×1 IMPLANT
PAD CAST 4YDX4 CTTN HI CHSV (CAST SUPPLIES) ×1 IMPLANT
PADDING CAST COTTON 3X4 STRL (CAST SUPPLIES) ×2
PADDING CAST COTTON 4X4 STRL (CAST SUPPLIES) ×2
PADDING CAST COTTON 6X4 STRL (CAST SUPPLIES) ×3 IMPLANT
PIN GUIDE DRILL TIP 2.8X300 (DRILL) ×6 IMPLANT
PLATE LOCK 8H 103 BILAT FIB (Plate) ×3 IMPLANT
PUTTY DBM STAGRAFT PLUS 5CC (Putty) ×3 IMPLANT
SCREW 3.5MM CORT LP 34MM (Screw) ×6 IMPLANT
SCREW ACE CAN 4.0 55M (Screw) ×3 IMPLANT
SCREW CANN 8.0X70 16 THRD (Screw) ×1 IMPLANT
SCREW CANNULATED 8.0X70MM (Screw) ×3 IMPLANT
SCREW CANNULATED 8X50MM FT (Screw) ×3 IMPLANT
SCREW CANNULATED 8X55MM (Screw) ×3 IMPLANT
SCREW CORT T15 30X3.5XST LCK (Screw) ×2 IMPLANT
SCREW CORTICAL 3.5X30MM (Screw) ×4 IMPLANT
SCREW LOW PROFILE 22MMX3.5MM (Screw) ×3 IMPLANT
SCREW NL LP 36X3.5 (Screw) ×3 IMPLANT
SOAP 2 % CHG 4 OZ (WOUND CARE) ×3 IMPLANT
SPONGE GAUZE 2X2 STER 10/PKG (GAUZE/BANDAGES/DRESSINGS) ×2
SPONGE GAUZE 4X4 12PLY STER LF (GAUZE/BANDAGES/DRESSINGS) ×3 IMPLANT
SPONGE LAP 18X18 X RAY DECT (DISPOSABLE) ×3 IMPLANT
STAPLER VISISTAT 35W (STAPLE) IMPLANT
STRIP CLOSURE SKIN 1/2X4 (GAUZE/BANDAGES/DRESSINGS) ×2 IMPLANT
SUCTION FRAZIER TIP 10 FR DISP (SUCTIONS) ×3 IMPLANT
SUT PROLENE 3 0 PS 2 (SUTURE) ×3 IMPLANT
SUT VIC AB 2-0 CT1 27 (SUTURE) ×4
SUT VIC AB 2-0 CT1 TAPERPNT 27 (SUTURE) ×2 IMPLANT
SUT VIC AB 3-0 PS2 18 (SUTURE) ×8
SUT VIC AB 3-0 PS2 18XBRD (SUTURE) ×4 IMPLANT
SYR CONTROL 10ML LL (SYRINGE) ×3 IMPLANT
TOWEL OR 17X24 6PK STRL BLUE (TOWEL DISPOSABLE) ×3 IMPLANT
TOWEL OR 17X26 10 PK STRL BLUE (TOWEL DISPOSABLE) ×3 IMPLANT
TUBE CONNECTING 12'X1/4 (SUCTIONS) ×1
TUBE CONNECTING 12X1/4 (SUCTIONS) ×2 IMPLANT
WASHER 8.0 (Orthopedic Implant) ×4 IMPLANT
WASHER CANN FLAT 8 (Orthopedic Implant) ×2 IMPLANT
WATER STERILE IRR 1000ML POUR (IV SOLUTION) ×3 IMPLANT

## 2014-07-19 NOTE — Anesthesia Postprocedure Evaluation (Signed)
Anesthesia Post Note  Patient: Parker Bond  Procedure(s) Performed: Procedure(s) (LRB): LEFT ARTHRODESIS ANKLE,TREATMENT OF TIBIAL OF NON UNION,POSSIBLE OF APPLICATION OF EX FIX,POSSIBLE AUTOGRAFT BONE (Left)  Anesthesia type: general  Patient location: PACU  Post pain: Pain level controlled  Post assessment: Patient's Cardiovascular Status Stable  Last Vitals:  Filed Vitals:   07/19/14 1335  BP: 122/64  Pulse: 97  Temp: 36.6 C  Resp: 18    Post vital signs: Reviewed and stable  Level of consciousness: sedated  Complications: No apparent anesthesia complications

## 2014-07-19 NOTE — Brief Op Note (Signed)
07/19/2014  11:05 AM  PATIENT:  Parker Bond  59 y.o. male  PRE-OPERATIVE DIAGNOSIS: 1.  LEFT ANKLE post TRAUMATIC ARTHRITIS       2.  NON UNION OF left TIBIAL pilon fracture      3.  Tight left heelcord  POST-OPERATIVE DIAGNOSIS: 1.  LEFT ANKLE post TRAUMATIC ARTHRITIS       2.  NON UNION OF left TIBIAL pilon fracture      3.  Tight left heelcord      4.  Left tibialis anterior tenonsynovitis  Procedure(s): 1.   LEFT ankle arthrodesis 2.  Open TREATMENT OF left TIBIAL pilon NON UNION 3.  Percutaneous tendo-achilles lengthening 4.  Tenolysis of left tibialis anterior tendon 5.  Left calcaneal autograft to the ankle joint  SURGEON:  Toni ArthursJohn Elliana Bal, MD  ASSISTANT: n/a  ANESTHESIA:   General, regional  EBL:  minimal   TOURNIQUET:  2:15 @ 250 mm Hg COMPLICATIONS:  None apparent  DISPOSITION:  Extubated, awake and stable to recovery.  DICTATION ID:  [id number]

## 2014-07-19 NOTE — H&P (Signed)
Parker Bond is an 59 y.o. male.   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  He presents now for open treatment of the non union and arthrodesis of the ankle joint.  Cleared for internal fixation by Dr. Ninetta Lights.  Pt is still on Bactrim DS.  Past Medical History  Diagnosis Date  . Hypertension   . H/O hiatal hernia   . High cholesterol   . GERD (gastroesophageal reflux disease)   . Hemochromatosis   . Anxiety   . Depression   . Shortness of breath     with exertion  . OSA on CPAP     on cpap  . Asthma     ? sporadic asthma, has not had an episode "for years"  . Pneumonia 1993  . Hypothyroidism   . Diabetes mellitus without complication     just started Metformin 05/28/14    Past Surgical History  Procedure Laterality Date  . Tibia fracture surgery Left 06/22/2013    APPLICATION OF EXTERNAL FIXATION FOR LEFT PILON FRACTURE; closed reduction  . Tonsillectomy    . Inguinal hernia repair Bilateral   . External fixation leg Left 06/22/2013    Procedure:  APPLICATION OF EXTERNAL FIXATION FOR LEFT PILON FRACTURE;  Surgeon: Verlee Rossetti, MD;  Location: [facility] OR;  Service: Orthopedics;  Laterality: Left;  . Rotator cuff repair Left 06/2013  . Repair / resect / transplant biceps tendon Left 08/2013  . Cyst excision Left     index finger  . Hardware removal Left 11/30/2013    Procedure: LEFT ANKLE REMOVAL HARDWARE, Percutaneous Tendon Lengthening;  Surgeon: Toni Arthurs, MD;  Location: [facility] OR;  Service: Orthopedics;  Laterality: Left;  . I&d extremity Left 11/30/2013    Procedure: INCISION AND DRAINAGE   ;  Surgeon: Toni Arthurs, MD;  Location: [facility] OR;  Service: Orthopedics;  Laterality: Left;  . Colonoscopy      Family History  Problem Relation Age of Onset  . Atrial fibrillation Mother   . Hypertension Mother   . CVA Father   . Atrial fibrillation Father    Social History:  reports that he quit smoking about  36 years ago. His smoking use included Cigarettes. He has a 15 pack-year smoking history. He has never used smokeless tobacco. He reports that he drinks alcohol. He reports that he does not use illicit drugs.  Allergies:  Allergies  Allergen Reactions  . Contrast Media [Iodinated Diagnostic Agents] Nausea And Vomiting  . Tape Rash    Medications Prior to Admission  Medication Sig Dispense Refill  . acetaminophen (TYLENOL) 500 MG tablet Take 1,000-1,500 mg by mouth 2 (two) times daily as needed for moderate pain.     Marland Kitchen aspirin EC 325 MG tablet Take 325 mg by mouth daily.    . citalopram (CELEXA) 20 MG tablet Take 20 mg by mouth daily.  11  . diphenhydrAMINE (BENADRYL) 25 MG tablet Take 25 mg by mouth every 6 (six) hours as needed for allergies.    . diphenhydramine-acetaminophen (TYLENOL PM) 25-500 MG TABS Take 2 tablets by mouth at bedtime.    . fluticasone (FLONASE) 50 MCG/ACT nasal spray Place 1 spray into both nostrils 2 (two) times daily.    Marland Kitchen gabapentin (NEURONTIN) 300 MG capsule Take 400 mg by mouth 2 (two) times daily.     Marland Kitchen levothyroxine (SYNTHROID, LEVOTHROID) 75 MCG tablet Take 75 mcg by mouth daily before  breakfast.    . lisinopril-hydrochlorothiazide (PRINZIDE,ZESTORETIC) 20-25 MG per tablet Take 1 tablet by mouth daily.    . metFORMIN (GLUCOPHAGE) 500 MG tablet Take 500 mg by mouth at bedtime.    . Multiple Vitamins-Minerals (MULTIVITAMIN PO) Take 1 tablet by mouth daily.    Marland Kitchen. omeprazole (PRILOSEC) 40 MG capsule Take 40 mg by mouth daily.    . potassium chloride (K-DUR,KLOR-CON) 10 MEQ tablet Take 10 mEq by mouth daily.    . pravastatin (PRAVACHOL) 20 MG tablet Take 20 mg by mouth at bedtime.     . sulfamethoxazole-trimethoprim (BACTRIM DS,SEPTRA DS) 800-160 MG per tablet Take 1 tablet by mouth 2 (two) times daily. 120 tablet 3  . diphenhydrAMINE (SOMINEX) 25 MG tablet Take 25 mg by mouth at bedtime as needed for sleep.    Marland Kitchen. HYDROcodone-acetaminophen (NORCO/VICODIN) 5-325 MG  per tablet   0    Results for orders placed or performed during the hospital encounter of 07/19/14 (from the past 48 hour(s))  Glucose, capillary     Status: Abnormal   Collection Time: 07/19/14  5:57 AM  Result Value Ref Range   Glucose-Capillary 150 (H) 70 - 99 mg/dL   No results found.  ROS  No recent f/c/n/v/wt loss  Blood pressure 149/94, pulse 88, temperature 98.1 F (36.7 C), temperature source Oral, resp. rate 22, weight 118.842 kg (262 lb), SpO2 97 %. Physical Exam  wn wd male in nad.  A and ox  4.  Mood and affect normal.  EOMI.  resp unlabored.  L ankle incision healed.  No signs of infection.  Sens to LT intact dorsally and plantarly.  5/5 strength in PF and DF of the ankle and toes.  No lymphadenopathy.  Brisk cap refill at the toes.  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  To OR for open treatment of the non union with possible bone grafting and arthrodesis of the ankle joint.  The risks and benefits of the alternative treatment options have been discussed in detail.  The patient wishes to proceed with surgery and specifically understands risks of bleeding, infection, nerve damage, blood clots, need for additional surgery, amputation and death.   Toni ArthursHEWITT, Parker Bond 07/19/2014, 7:18 AM

## 2014-07-19 NOTE — Op Note (Signed)
Parker Bond, Parker Bond NO.:  [id number]  MEDICAL RECORD NO.:  [id number]  LOCATION:  4N08C                        FACILITY:  [facility]  PHYSICIAN:  Toni Arthurs, MD        DATE OF BIRTH:  Mar 04, 1956  DATE OF PROCEDURE:  07/19/2014 DATE OF DISCHARGE:                              OPERATIVE REPORT   PREOPERATIVE DIAGNOSES: 1. Left ankle posttraumatic arthritis. 2. Nonunion of left tibial pilon fracture. 3. Tight left heel cord. 4. Left tibialis anterior tenosynovitis.  POSTOPERATIVE DIAGNOSES: 1. Left ankle posttraumatic arthritis. 2. Nonunion of left tibial pilon fracture. 3. Tight left heel cord. 4. Left tibialis anterior tenosynovitis.  PROCEDURE: 1. Left open ankle arthrodesis. 2. Open treatment of left tibial pilon nonunion. 3. Percutaneous tendo-Achilles lengthening through a separate     incision. 4. Tenolysis of the left tibialis anterior tendon. 5. Autograft bone from the left calcaneus to the ankle joint.  SURGEON:  Toni Arthurs, MD  ANESTHESIA:  General, regional.  ESTIMATED BLOOD LOSS:  Minimal.  TOURNIQUET TIME:  Two hours and 15 minutes at 250 mmHg.  COMPLICATIONS:  None apparent.  DISPOSITION:  Extubated, awake and stable to recovery.  INDICATIONS FOR PROCEDURE:  The patient is a 59 year old male who suffered a left tibial pilon fracture, last year.  He underwent ORIF which was complicated by postoperative infection.  He required removal of his hardware and debridement with IV antibiotics.  He cleared this infection, was evaluated by Dr. Ninetta Bond.  Most recent radiographs in clinic show a nonunion of the anteromedial fragment of the tibial pilon fracture as well as posttraumatic ankle arthritis.  He presents now for open treatment of a tibial pilon nonunion as well as arthrodesis of the ankle joint.  He also is noted to have a tight heel cord on physical exam.  He understands the risks and benefits, the alternative treatment options, and  elects surgical treatment.  He specifically understands risks of bleeding, infection, nerve damage, blood clots, need for additional surgery, continued pain, amputation, and death.  PROCEDURE IN DETAIL:  After preoperative consent was obtained and the correct operative site was identified, the patient was brought to the operating room and placed supine on the operating table.  General anesthesia was induced.  Preoperative antibiotics were administered. Surgical time-out was taken.  Left lower extremity was prepped and draped in standard sterile fashion, the tourniquet around the thigh. The ankle was inspected carefully.  The heel cord was again noted to be tight, so the decision was made to proceed with percutaneous tendo- Achilles lengthening.  A triple hemisection technique was used with a 15 blade.  This allowed ankle dorsiflexion of approximately 30 degrees. The extremity was then exsanguinated and tourniquet was inflated to 250 mmHg.  Anterior incision was again opened sharply and extended distally. Sharp and blunt dissection was carried down through the skin and subcutaneous tissues to the side of the nonunion.  The ankle joint and the nonunion site were exposed and carefully cleaned of all scar tissue and fibrous nonunion tissue.  The ankle joint was cleaned of all remaining articular cartilage.  The wound was then irrigated copiously. The subchondral bone was then perforated with  a 2.5-mm drill bit and a rongeur was used to remove the remaining subchondral bone on both sides of the joint.  Attention was then turned to the lateral aspect of the calcaneus where an oblique incision was made.  Sharp dissection was carried down through the skin and subcutaneous tissue.  A 10 mm Trephine was used to harvest several passes of bone graft from the calcaneal tuberosity.  The wound was then closed with nylon.  The bone graft was then packed into the ankle joint.  The ankle joint was  reduced and provisionally pinned.  AP and lateral radiographs confirmed appropriate position of the guide pin as well as the ankle joint.  A second guide pin was then placed from posterolateral distal tibia and across into the neck of the talus.  An 8 mm partially threaded cannulated screw from [employer] was inserted with a washer.  It was tightened, pulling the talus securely into the tibiotalar articulation.  The anterolateral guide pin was over drilled and a fully threaded 8 mm screw was inserted.  It was noted to have excellent purchase as well.  The anteromedial nonunion fragment was then reduced and held with a Weber clamp and guide pin.  A 4 mm partially threaded cannulated screw was then inserted over the guide pin pulling the fragment of bone securely into its reduced position which also helped to compress the tibiotalar joint.  The anterior void was then packed with cancellous chips and demineralized bone matrix.  A composite 1/3rd tubular plate from the [employer] small frag set was then contoured to fit the anteromedial joint line.  It was secured distally into the talar head and neck with a locking and nonlocking screws.  Proximally, it was secured into the distal tibia with 3 bicortical screws.  AP, mortise and lateral radiographs were obtained and confirmed appropriate reduction of the joint, appropriate position and length of all hardware.  The wound was then irrigated copiously.  The deep portion of the wound was sprinkled with vancomycin powder and 0 Vicryl sutures were used to approximate the deep tissue.  Tourniquet had been released and hemostasis was achieved prior to closure.  The tibialis anterior tendon was noted to be quite swollen and adherent to the adjacent soft tissues. Careful tenolysis was carried along tibialis anterior to mobilize it appropriately.  The thickened portion of the tendon was excised sharply with a 15 blade.  The superficial subcutaneous tissues  were then approximated with inverted simple sutures of 3-0 Monocryl and horizontal mattress sutures of 3-0 nylon were used to close all the remaining incisions.  Sterile dressings were applied followed by well-padded short- leg splint.  The patient was awakened from anesthesia and transported to recovery room in stable condition.  FOLLOWUP PLAN:  The patient will be nonweightbearing on the left lower extremity with a splint.  He will be observed overnight for pain control and to get 24 hours of vancomycin.  RADIOGRAPHS:  AP, mortise and lateral radiographs of the left ankle are obtained as well as an AP of the left foot.  These show interval reduction of the nonunion and arthrodesis of the ankle joint.  Hardware is appropriately positioned and of the appropriate length.     Toni ArthursJohn Makenli Derstine, MD     JH/MEDQ  D:  07/19/2014  T:  07/19/2014  Job:  [id number]

## 2014-07-19 NOTE — Anesthesia Procedure Notes (Addendum)
Anesthesia Regional Block:  Popliteal block  Pre-Anesthetic Checklist: ,, timeout performed, Correct Patient, Correct Site, Correct Laterality, Correct Procedure, Correct Position, site marked, Risks and benefits discussed,  Surgical consent,  Pre-op evaluation,  At surgeon's request and post-op pain management  Laterality: Left  Prep: chloraprep       Needles:  Injection technique: Single-shot  Needle Type: Echogenic Stimulator Needle     Needle Length: 9cm 9 cm Needle Gauge: 21 and 21 G    Additional Needles:  Procedures: ultrasound guided (picture in chart) and nerve stimulator Popliteal block  Nerve Stimulator or Paresthesia:  Response: 0.4 mA,   Additional Responses:   Narrative:  Start time: 07/19/2014 7:05 AM End time: 07/19/2014 7:20 AM Injection made incrementally with aspirations every 5 mL.  Performed by: Personally  Anesthesiologist: Arta BruceSSEY, KEVIN  Additional Notes: Monitors applied. Patient sedated. Sterile prep and drape,hand hygiene and sterile gloves were used. Relevant anatomy identified.Needle position confirmed.Local anesthetic injected incrementally after negative aspiration. Local anesthetic spread visualized around nerve(s). Vascular puncture avoided. No complications. Image printed for medical record.The patient tolerated the procedure well.  Additional Saphenous nerve block performed. 15cc Local Anesthetic mixture placed under ultrasonic guidance along the medio-inferior border of the Sartorious muscle 6 inches above the knee.  No Problems encountered.  Arta BruceKevin Ossey MD    Procedure Name: LMA Insertion Date/Time: 07/19/2014 7:46 AM Performed by: Wilder GladeWINN, Juda Lajeunesse G Pre-anesthesia Checklist: Patient identified, Emergency Drugs available, Suction available, Patient being monitored and Timeout performed Patient Re-evaluated:Patient Re-evaluated prior to inductionOxygen Delivery Method: Circle system utilized Preoxygenation: Pre-oxygenation with 100%  oxygen Intubation Type: IV induction Ventilation: Mask ventilation without difficulty LMA: LMA inserted LMA Size: 5.0 Number of attempts: 1 Placement Confirmation: positive ETCO2 and breath sounds checked- equal and bilateral ETT to lip (cm): yes. Dental Injury: Teeth and Oropharynx as per pre-operative assessment     Performed by: Wilder GladeWINN, Leaner Morici G

## 2014-07-19 NOTE — Evaluation (Signed)
Physical Therapy Evaluation Patient Details Name: Parker HarmRonnie Cashaw MRN: [id number] DOB: 1955/07/08 Today's Date: 07/19/2014   History of Present Illness  59 y.o. male s/p LEFT ARTHRODESIS ANKLE, Open TREATMENT OF left TIBIAL pilon NON UNION, Percutaneous tendo-achilles lengthening, Tenolysis of left tibialis anterior tendon,  and Left calcaneal autograft to the ankle joint.  Clinical Impression  Patient is seen following the above procedure and presents with functional limitations due to the deficits listed below (see PT Problem List). Mobilizes generally well with a rolling walker, maintaining NWB through LLE throughout therapy session. Became nauseous and diaphoretic but no emesis; this resolved shortly after sitting. He will have adequate care from family at home. Patient will benefit from skilled PT to increase their independence and safety with mobility to allow discharge to the venue listed below.       Follow Up Recommendations No PT follow up    Equipment Recommendations  None recommended by PT    Recommendations for Other Services OT consult     Precautions / Restrictions Precautions Precautions: Fall Restrictions Weight Bearing Restrictions: Yes LLE Weight Bearing: Non weight bearing      Mobility  Bed Mobility Overal bed mobility: Modified Independent                Transfers Overall transfer level: Needs assistance Equipment used: Rolling walker (2 wheeled) Transfers: Sit to/from Stand Sit to Stand: Supervision         General transfer comment: supervision for safety. VC for hand placement.  Ambulation/Gait Ambulation/Gait assistance: Min guard Ambulation Distance (Feet): 20 Feet Assistive device: Rolling walker (2 wheeled) Gait Pattern/deviations:  ("hop-to" pattern) Gait velocity: decreased   General Gait Details: Educated on safe DME use with a rolling walker. Safely maintained NWB on LLE during distance. Limited by nausea.  Stairs             Wheelchair Mobility    Modified Rankin (Stroke Patients Only)       Balance Overall balance assessment: Needs assistance Sitting-balance support: No upper extremity supported;Feet supported Sitting balance-Leahy Scale: Normal     Standing balance support: Single extremity supported Standing balance-Leahy Scale: Poor                               Pertinent Vitals/Pain Pain Assessment: No/denies pain    Home Living Family/patient expects to be discharged to:: Private residence Living Arrangements: Spouse/significant other Available Help at Discharge: Family;Available 24 hours/day Type of Home: House Home Access: Ramped entrance     Home Layout: One level Home Equipment: Wheelchair - power;Wheelchair - manual;Bedside commode;Walker - 2 wheels;Shower seat      Prior Function Level of Independence: Independent with assistive device(s)         Comments: cane and walker for mobility as needed.     Hand Dominance   Dominant Hand: Right    Extremity/Trunk Assessment   Upper Extremity Assessment: Defer to OT evaluation           Lower Extremity Assessment: LLE deficits/detail   LLE Deficits / Details: unable to move toes, no sensation.      Communication   Communication: No difficulties  Cognition Arousal/Alertness: Awake/alert Behavior During Therapy: WFL for tasks assessed/performed Overall Cognitive Status: Within Functional Limits for tasks assessed                      General Comments General comments (skin integrity, edema, etc.): Pt became  nauseous during therapy. No emesis, felt better after sitting in recliner. LLE left elevated    Exercises General Exercises - Lower Extremity Ankle Circles/Pumps: AROM;Right;10 reps;Seated Quad Sets: Strengthening;Left;10 reps;Seated Gluteal Sets: Strengthening;Both;10 reps;Seated Long Arc Quad: Strengthening;Left;5 reps;Seated      Assessment/Plan    PT Assessment Patient needs  continued PT services  PT Diagnosis Difficulty walking;Abnormality of gait;Acute pain   PT Problem List Decreased strength;Decreased range of motion;Decreased activity tolerance;Decreased balance;Decreased mobility;Decreased knowledge of use of DME;Impaired sensation;Pain  PT Treatment Interventions DME instruction;Gait training;Functional mobility training;Therapeutic activities;Therapeutic exercise;Balance training;Neuromuscular re-education;Patient/family education;Modalities   PT Goals (Current goals can be found in the Care Plan section) Acute Rehab PT Goals Patient Stated Goal: Go home PT Goal Formulation: With patient Time For Goal Achievement: 07/26/14 Potential to Achieve Goals: Good    Frequency Min 5X/week   Barriers to discharge        Co-evaluation               End of Session Equipment Utilized During Treatment: Gait belt;Oxygen Activity Tolerance: Other (comment) (Limited by nausea) Patient left: in chair;with call bell/phone within reach;with family/visitor present Nurse Communication: Mobility status         Time: 1610-9604 PT Time Calculation (min) (ACUTE ONLY): 27 min   Charges:   PT Evaluation $Initial PT Evaluation Tier I: 1 Procedure PT Treatments $Gait Training: 8-22 mins   PT G CodesBerton Mount 07/19/2014, 5:19 PM [place] [address] [place], [place] [phone]

## 2014-07-19 NOTE — Anesthesia Preprocedure Evaluation (Addendum)
Anesthesia Evaluation  Patient identified by MRN, date of birth, ID band Patient awake    Reviewed: Allergy & Precautions, NPO status , Patient's Chart, lab work & pertinent test results  Airway Mallampati: II  TM Distance: >3 FB Neck ROM: Full    Dental   Pulmonary sleep apnea , former smoker,    Pulmonary exam normal       Cardiovascular hypertension, Pt. on medications     Neuro/Psych    GI/Hepatic GERD-  Medicated and Controlled,  Endo/Other  diabetes, Type 2, Oral Hypoglycemic Agents  Renal/GU      Musculoskeletal   Abdominal   Peds  Hematology   Anesthesia Other Findings   Reproductive/Obstetrics                            Anesthesia Physical Anesthesia Plan  ASA: III  Anesthesia Plan: General   Post-op Pain Management:    Induction: Intravenous  Airway Management Planned: LMA  Additional Equipment:   Intra-op Plan:   Post-operative Plan: Extubation in OR  Informed Consent: I have reviewed the patients History and Physical, chart, labs and discussed the procedure including the risks, benefits and alternatives for the proposed anesthesia with the patient or authorized representative who has indicated his/her understanding and acceptance.     Plan Discussed with: CRNA and Surgeon  Anesthesia Plan Comments:         Anesthesia Quick Evaluation

## 2014-07-19 NOTE — Transfer of Care (Signed)
Immediate Anesthesia Transfer of Care Note  Patient: Parker Bond  Procedure(s) Performed: Procedure(s): LEFT ARTHRODESIS ANKLE,TREATMENT OF TIBIAL OF NON UNION,POSSIBLE OF APPLICATION OF EX FIX,POSSIBLE AUTOGRAFT BONE (Left)  Patient Location: PACU  Anesthesia Type:General  Level of Consciousness: awake and alert   Airway & Oxygen Therapy: Patient Spontanous Breathing and Patient connected to nasal cannula oxygen  Post-op Assessment: Report given to RN and Post -op Vital signs reviewed and stable  Post vital signs: Reviewed and stable  Last Vitals:  Filed Vitals:   07/19/14 1120  BP: 124/64  Pulse: 113  Temp: 37.4 C  Resp: 19    Complications: No apparent anesthesia complications

## 2014-07-20 LAB — GLUCOSE, CAPILLARY
GLUCOSE-CAPILLARY: 152 mg/dL — AB (ref 70–99)
Glucose-Capillary: 151 mg/dL — ABNORMAL HIGH (ref 70–99)

## 2014-07-20 MED ORDER — SENNA 8.6 MG PO TABS: 1.0000 | ORAL_TABLET | Freq: Two times a day (BID) | ORAL | Status: DC

## 2014-07-20 MED ORDER — OXYCODONE HCL 5 MG PO TABS: 5.0000 mg | ORAL_TABLET | ORAL | Status: DC | PRN

## 2014-07-20 MED ORDER — DOCUSATE SODIUM 100 MG PO CAPS: 100.0000 mg | ORAL_CAPSULE | Freq: Two times a day (BID) | ORAL | Status: DC

## 2014-07-20 NOTE — Discharge Summary (Signed)
Physician Discharge Summary  Patient ID: Parker Bond MRN: [id number] DOB/AGE: 61-19-1957 61 y.o.  Admit date: 07/19/2014 Discharge date: 07/20/2014  Admission Diagnoses:  Diabetes, htn, dyslipidemia, L tibial pilon fracture nonunion, L ankle post traumatic arthritis  Discharge Diagnoses:  Same s/p left ankle arthrodesis and treatment of nonunion  Discharged Condition: stable  Hospital Course: Pt was admitted and taken to surgery on 4/21.  He underwent left ankle arthrodesis and open treatment of his nonunion with autograft bone from calcaneus.  He tolerated this procedure well and was up withPT the afternoon of surgery.  He is discharged to home in stable condition.  Consults: none  Significant Diagnostic Studies: none  Treatments: surgery: as above  Discharge Exam: Blood pressure 98/54, pulse 86, temperature 98.3 F (36.8 C), temperature source Oral, resp. rate 18, weight 118.842 kg (262 lb), SpO2 96 %. wn wd male in nad.  L LE immobilized in a splint.  NVI at toes.  Disposition: 01-Home or Self Care  Discharge Instructions    Call MD / Call 911    Complete by:  As directed   If you experience chest pain or shortness of breath, CALL 911 and be transported to the hospital emergency room.  If you develope a fever above 101 F, pus (white drainage) or increased drainage or redness at the wound, or calf pain, call your surgeon's office.     Constipation Prevention    Complete by:  As directed   Drink plenty of fluids.  Prune juice may be helpful.  You may use a stool softener, such as Colace (over the counter) 100 mg twice a day.  Use MiraLax (over the counter) for constipation as needed.     Diet - low sodium heart healthy    Complete by:  As directed      Increase activity slowly as tolerated    Complete by:  As directed      Non weight bearing    Complete by:  As directed   Laterality:  left  Extremity:  Lower            Medication List    STOP taking these  medications        HYDROcodone-acetaminophen 5-325 MG per tablet  Commonly known as:  NORCO/VICODIN      TAKE these medications        acetaminophen 500 MG tablet  Commonly known as:  TYLENOL  Take 1,000-1,500 mg by mouth 2 (two) times daily as needed for moderate pain.     aspirin EC 325 MG tablet  Take 325 mg by mouth daily.     citalopram 20 MG tablet  Commonly known as:  CELEXA  Take 20 mg by mouth daily.     diphenhydrAMINE 25 MG tablet  Commonly known as:  SOMINEX  Take 25 mg by mouth at bedtime as needed for sleep.     diphenhydrAMINE 25 MG tablet  Commonly known as:  BENADRYL  Take 25 mg by mouth every 6 (six) hours as needed for allergies.     diphenhydramine-acetaminophen 25-500 MG Tabs  Commonly known as:  TYLENOL PM  Take 2 tablets by mouth at bedtime.     docusate sodium 100 MG capsule  Commonly known as:  COLACE  Take 1 capsule (100 mg total) by mouth 2 (two) times daily.     fluticasone 50 MCG/ACT nasal spray  Commonly known as:  FLONASE  Place 1 spray into both nostrils 2 (two) times daily.  gabapentin 300 MG capsule  Commonly known as:  NEURONTIN  Take 400 mg by mouth 2 (two) times daily.     levothyroxine 75 MCG tablet  Commonly known as:  SYNTHROID, LEVOTHROID  Take 75 mcg by mouth daily before breakfast.     lisinopril-hydrochlorothiazide 20-25 MG per tablet  Commonly known as:  PRINZIDE,ZESTORETIC  Take 1 tablet by mouth daily.     metFORMIN 500 MG tablet  Commonly known as:  GLUCOPHAGE  Take 500 mg by mouth at bedtime.     MULTIVITAMIN PO  Take 1 tablet by mouth daily.     omeprazole 40 MG capsule  Commonly known as:  PRILOSEC  Take 40 mg by mouth daily.     oxyCODONE 5 MG immediate release tablet  Commonly known as:  Oxy IR/ROXICODONE  Take 1-2 tablets (5-10 mg total) by mouth every 4 (four) hours as needed for moderate pain, severe pain or breakthrough pain.     potassium chloride 10 MEQ tablet  Commonly known as:   K-DUR,KLOR-CON  Take 10 mEq by mouth daily.     pravastatin 20 MG tablet  Commonly known as:  PRAVACHOL  Take 20 mg by mouth at bedtime.     senna 8.6 MG Tabs tablet  Commonly known as:  SENOKOT  Take 1 tablet (8.6 mg total) by mouth 2 (two) times daily.     sulfamethoxazole-trimethoprim 800-160 MG per tablet  Commonly known as:  BACTRIM DS,SEPTRA DS  Take 1 tablet by mouth 2 (two) times daily.           Follow-up Information    Follow up with Temple Ewart, Jonny RuizJOHN, MD. Schedule an appointment as soon as possible for a visit in 2 weeks.   Specialty:  Orthopedic Surgery   Contact information:   [address] [place] [id number] [phone]       Signed: Toni ArthursHEWITT, Shandale Malak 07/20/2014, 7:48 AM

## 2014-07-20 NOTE — Progress Notes (Signed)
UR complete.  Jenefer Woerner RN, MSN 

## 2014-07-20 NOTE — Progress Notes (Signed)
Patient d/c home. D/c instruction given and patient verbalized understanding.  

## 2014-07-20 NOTE — Progress Notes (Signed)
Physical Therapy Treatment Patient Details Name: Parker Bond MRN: [id number] DOB: 08-11-1955 Today's Date: 07/20/2014    History of Present Illness 59 y.o. male s/p LEFT ARTHRODESIS ANKLE, Open TREATMENT OF left TIBIAL pilon NON UNION, Percutaneous tendo-achilles lengthening, Tenolysis of left tibialis anterior tendon,  and Left calcaneal autograft to the ankle joint.    PT Comments    Patient progressing well with mobility. Continues to exhibit dizziness initially with changes in position - BP stable. Encourage AROM left toes/knee for edema management. Fatigues quickly. Education on performing short bouts of activity at home for safety. Pt to d/c today - will have assist from spouse. Will continue to follow if still in hospital to improve endurance and safe mobility.    Follow Up Recommendations  Supervision/Assistance - 24 hour;No PT follow up     Equipment Recommendations  None recommended by PT    Recommendations for Other Services       Precautions / Restrictions Precautions Precautions: Fall Restrictions Weight Bearing Restrictions: Yes LLE Weight Bearing: Non weight bearing    Mobility  Bed Mobility Overal bed mobility: Modified Independent                Transfers Overall transfer level: Needs assistance Equipment used: Rolling walker (2 wheeled) Transfers: Sit to/from Stand Sit to Stand: Supervision         General transfer comment: supervision for safety. VC for hand placement. + dizziness upon standing - resolved within 1 minute.  Ambulation/Gait Ambulation/Gait assistance: Min guard Ambulation Distance (Feet): 28 Feet Assistive device: Rolling walker (2 wheeled) Gait Pattern/deviations: Trunk flexed ("hop to" pattern) Gait velocity: decreased   General Gait Details: Pt impulsive and increasing gait speed towards end of bout secondary to shoulder "giving out." Education on safety and taking smaller "hops" and to take standing rest breaks as  needed. Maintained NWB LLE. Dizziness towards end of gait.   Stairs            Wheelchair Mobility    Modified Rankin (Stroke Patients Only)       Balance Overall balance assessment: Needs assistance Sitting-balance support: Feet supported;No upper extremity supported Sitting balance-Leahy Scale: Normal     Standing balance support: During functional activity Standing balance-Leahy Scale: Poor Standing balance comment: Relient on RW for support.                     Cognition Arousal/Alertness: Awake/alert Behavior During Therapy: WFL for tasks assessed/performed Overall Cognitive Status: Within Functional Limits for tasks assessed                      Exercises      General Comments General comments (skin integrity, edema, etc.): BP118/61 sitting EOB +dizziness, HR 89 bpm. BP standing 117/68, HR 102 bpm. HR increased to 127 bpm post ambulation bout.      Pertinent Vitals/Pain Pain Assessment: No/denies pain    Home Living                      Prior Function            PT Goals (current goals can now be found in the care plan section) Progress towards PT goals: Progressing toward goals    Frequency  Min 5X/week    PT Plan Current plan remains appropriate    Co-evaluation             End of Session Equipment Utilized During Treatment: Gait belt Activity  Tolerance: Patient limited by fatigue Patient left: in bed;with call bell/phone within reach;with bed alarm set;with family/visitor present     Time: 1030-1050 PT Time Calculation (min) (ACUTE ONLY): 20 min  Charges:  $Gait Training: 8-22 mins                    G CodesAlvie Heidelberg A 08/09/14, 10:56 AM Alvie Heidelberg, PT, DPT [phone]

## 2014-07-20 NOTE — Discharge Instructions (Signed)
Parker Hinderliter, MD °[facility] ° °Please read the following information regarding your care after surgery. ° °Medications  °You only need a prescription for the narcotic pain medicine (ex. oxycodone, Percocet, Norco).  All of the other medicines listed below are available over the counter. °X acetominophen (Tylenol) 650 mg every 4-6 hours as you need for minor pain °X oxycodone as prescribed for moderate to severe pain °?  ° °Narcotic pain medicine (ex. oxycodone, Percocet, Vicodin) will cause constipation.  To prevent this problem, take the following medicines while you are taking any pain medicine. °X docusate sodium (Colace) 100 mg twice a day X senna (Senokot) 2 tablets twice a day ° °X To help prevent blood clots, take an aspirin (325 mg) once a day for a month after surgery.  You should also get up every hour while you are awake to move around.   ° °Weight Bearing °? Bear weight when you are able on your operated leg or foot. °? Bear weight only on the heel of your operated foot in the post-op shoe. °X Do not bear any weight on the operated leg or foot. ° °Cast / Splint / Dressing °X Keep your splint or cast clean and dry.  Don’t put anything (coat hanger, pencil, etc) down inside of it.  If it gets damp, use a hair dryer on the cool setting to dry it.  If it gets soaked, call the office to schedule an appointment for a cast change. °? Remove your dressing 3 days after surgery and cover the incisions with dry dressings.   ° °After your dressing, cast or splint is removed; you may shower, but do not soak or scrub the wound.  Allow the water to run over it, and then gently pat it dry. ° °Swelling °It is normal for you to have swelling where you had surgery.  To reduce swelling and pain, keep your toes above your nose for at least 3 days after surgery.  It may be necessary to keep your foot or leg elevated for several weeks.  If it hurts, it should be elevated. ° °Follow Up °Call my office at  [phone] when you are discharged from the hospital or surgery center to schedule an appointment to be seen two weeks after surgery. ° °Call my office at [phone] if you develop a fever >101.5° F, nausea, vomiting, bleeding from the surgical site or severe pain.   ° ° °

## 2014-07-22 LAB — TISSUE CULTURE: Culture: NO GROWTH

## 2014-07-24 LAB — ANAEROBIC CULTURE

## 2014-07-25 ENCOUNTER — Encounter (HOSPITAL_COMMUNITY): Payer: Self-pay | Admitting: Orthopedic Surgery

## 2014-09-04 ENCOUNTER — Telehealth: Payer: Self-pay | Admitting: *Deleted

## 2014-09-04 NOTE — Telephone Encounter (Signed)
Received prior authorization request for bactrim DS twice daily (#120, refill 3).  Per office notes, patient was to complete 6 weeks of bactrim after surgery - he has completed this recommended course. Please advise if he should continue bactrim.  Patient to follow up in clinic 6/14. Please Advise. Andree CossHowell, Monta Maiorana M, RN

## 2014-09-06 NOTE — Telephone Encounter (Signed)
Workman's Comp Case, if rx needs continued call the pharmacy.  Pharmacy will process PA through [facility] if rx needs continued.

## 2014-09-11 ENCOUNTER — Ambulatory Visit: Payer: Self-pay | Admitting: Infectious Diseases

## 2014-09-12 NOTE — Telephone Encounter (Signed)
Pt canceled f/u visit 09/11/14, rescheduled.  MD please advise about additional refills on Bactrim DS rx.

## 2014-09-13 NOTE — Telephone Encounter (Signed)
Notified [facility] and the pt.

## 2014-09-13 NOTE — Telephone Encounter (Signed)
He can stop.

## 2014-10-22 ENCOUNTER — Encounter: Payer: Self-pay | Admitting: Infectious Diseases

## 2014-10-22 ENCOUNTER — Ambulatory Visit (INDEPENDENT_AMBULATORY_CARE_PROVIDER_SITE_OTHER): Payer: Worker's Compensation | Admitting: Infectious Diseases

## 2014-10-22 VITALS — BP 129/90 | HR 102 | Temp 98.0°F | Ht 72.0 in | Wt 255.0 lb

## 2014-10-22 DIAGNOSIS — M869 Osteomyelitis, unspecified: Secondary | ICD-10-CM

## 2014-10-22 NOTE — Progress Notes (Signed)
   Subjective:    Patient ID: Parker Bond, male    DOB: June 27, 1955, 59 y.o.   MRN: [id number]  HPI 59 yo M with hx of fall March 2015 with resulting L ankle fracture. He had external fixateur placed as well as pins. This was replaced with plates July 11, 252.  Since he had persistent drainage from his wounds, then presented 11-30-13 and had removal of most (but not all) of his hardware. There was a slime layer noted in the OR. ESR 7. CRP <05 He was started on cefepime/vanco. His Cx were negative and he was d/c home on 9-8 on cefepime/vanco. He completed this 01-11-14. His course was complicated by fungal rash in his groin as well as a stitch abscess. He was treated with diflucan for 2 weeks.  He was then transitioned to bactrim for his osteomyelitis.   He had poor wound healing and was taken back to OR on 03-06-14 and had closure of his wound and removal of the rest of his hardware. He was seen in ID f/u on 12-18. Was off anbx. He was restarted on bactrim with plan for 5 months.   He returned to OR on 4/21.  He underwent left ankle arthrodesis (plate and screws) and open treatment of his nonunion with autograft bone from calcaneus.  He did well and went home in 24h. His Cx were (-).  He is starting to do some weight bearing.  He has been off bactrim until 1 week where he dropped something on his wound and had a small wound break. It has since healed, closed well.  No wound d/c.   Review of Systems  Constitutional: Negative for fever and chills.       Objective:   Physical Exam  Constitutional: He appears well-developed and well-nourished.  Musculoskeletal:       Feet:       Assessment & Plan:

## 2014-10-22 NOTE — Assessment & Plan Note (Signed)
He is off anbx (excluding his recent 1 week use after skin tear) and is doing very well.  Would not recheck his ESR and CRP today as they were not elevated previously.  Will see him back prn.

## 2015-02-20 IMAGING — CR DG SHOULDER 2+V*L*
2 series · 2 of 2 positions shown · non-contrast
Comparison: None.

CLINICAL DATA: 58-year-old male status post fall on the left
shoulder with pain. Initial encounter.

EXAM:
LEFT SHOULDER - 2+ VIEW

[x shoulder ap left (1 of 2)]
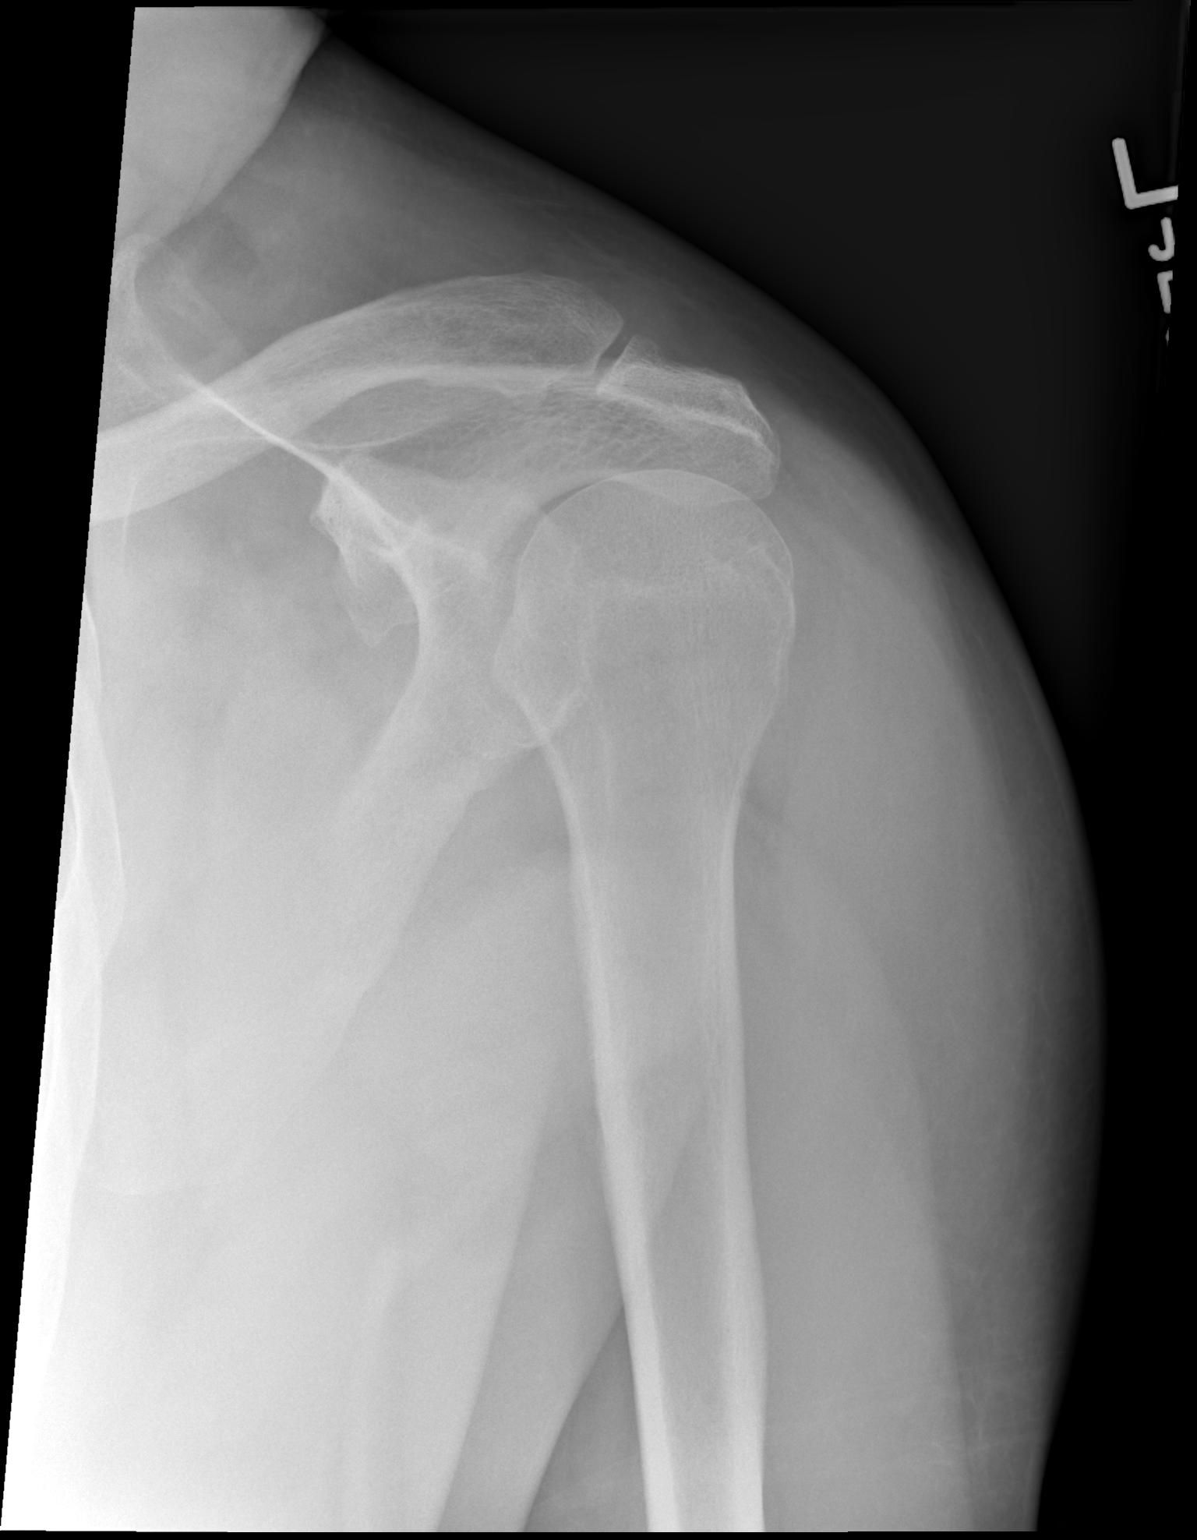

[x shoulder ap left (2 of 2)]
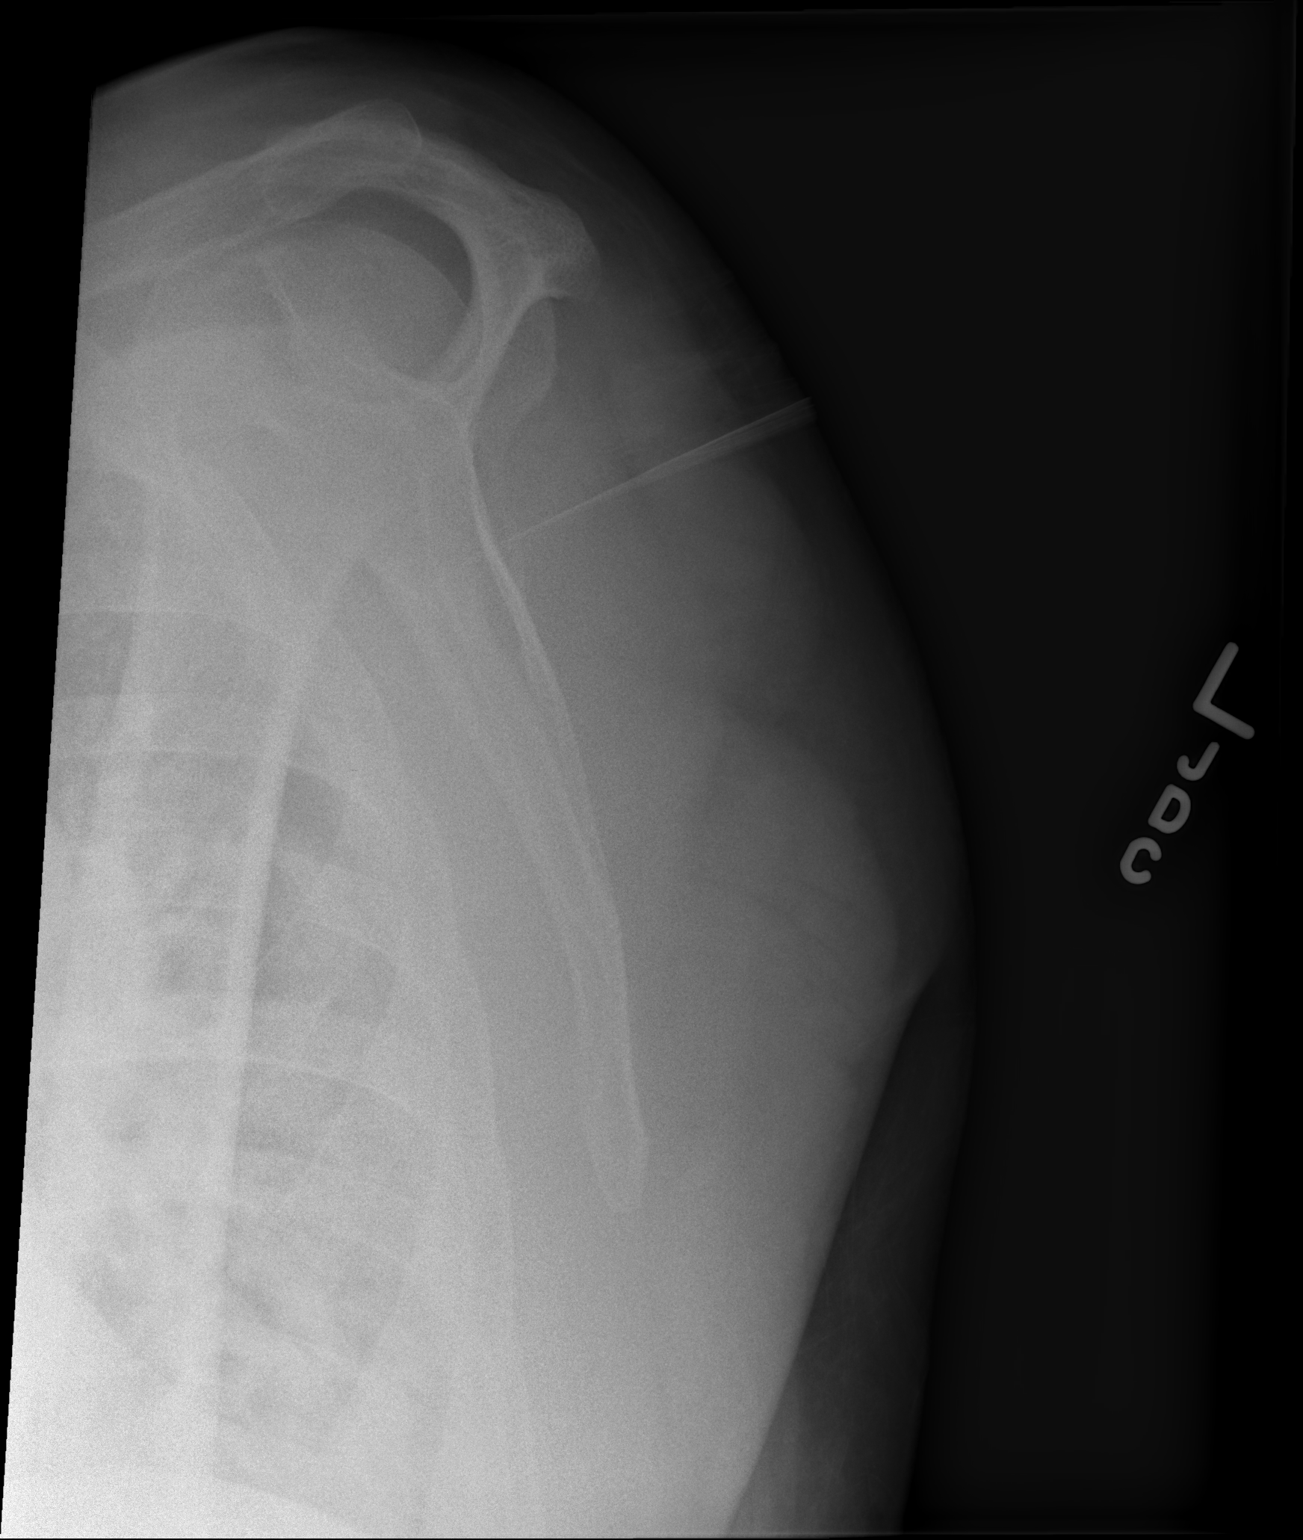

[2 of 2 positions shown; findings below may reference images not displayed]

FINDINGS: Bone mineralization is within normal limits. Proximal left humerus
appears intact. No glenohumeral joint dislocation. Visible left
clavicle intact. No scapula fracture identified. Grossly intact
visible left ribs.
IMPRESSION: No acute fracture or dislocation identified about the left shoulder.

## 2015-02-21 IMAGING — CR DG OS CALCIS 2+V*R*
2 series · 2 of 2 positions shown · non-contrast
Comparison: None.

CLINICAL DATA: Fall with Jasmin Joy fracture of left ankle.

EXAM:
RIGHT OS CALCIS - 2+ VIEW

[x calcaneus axial right]
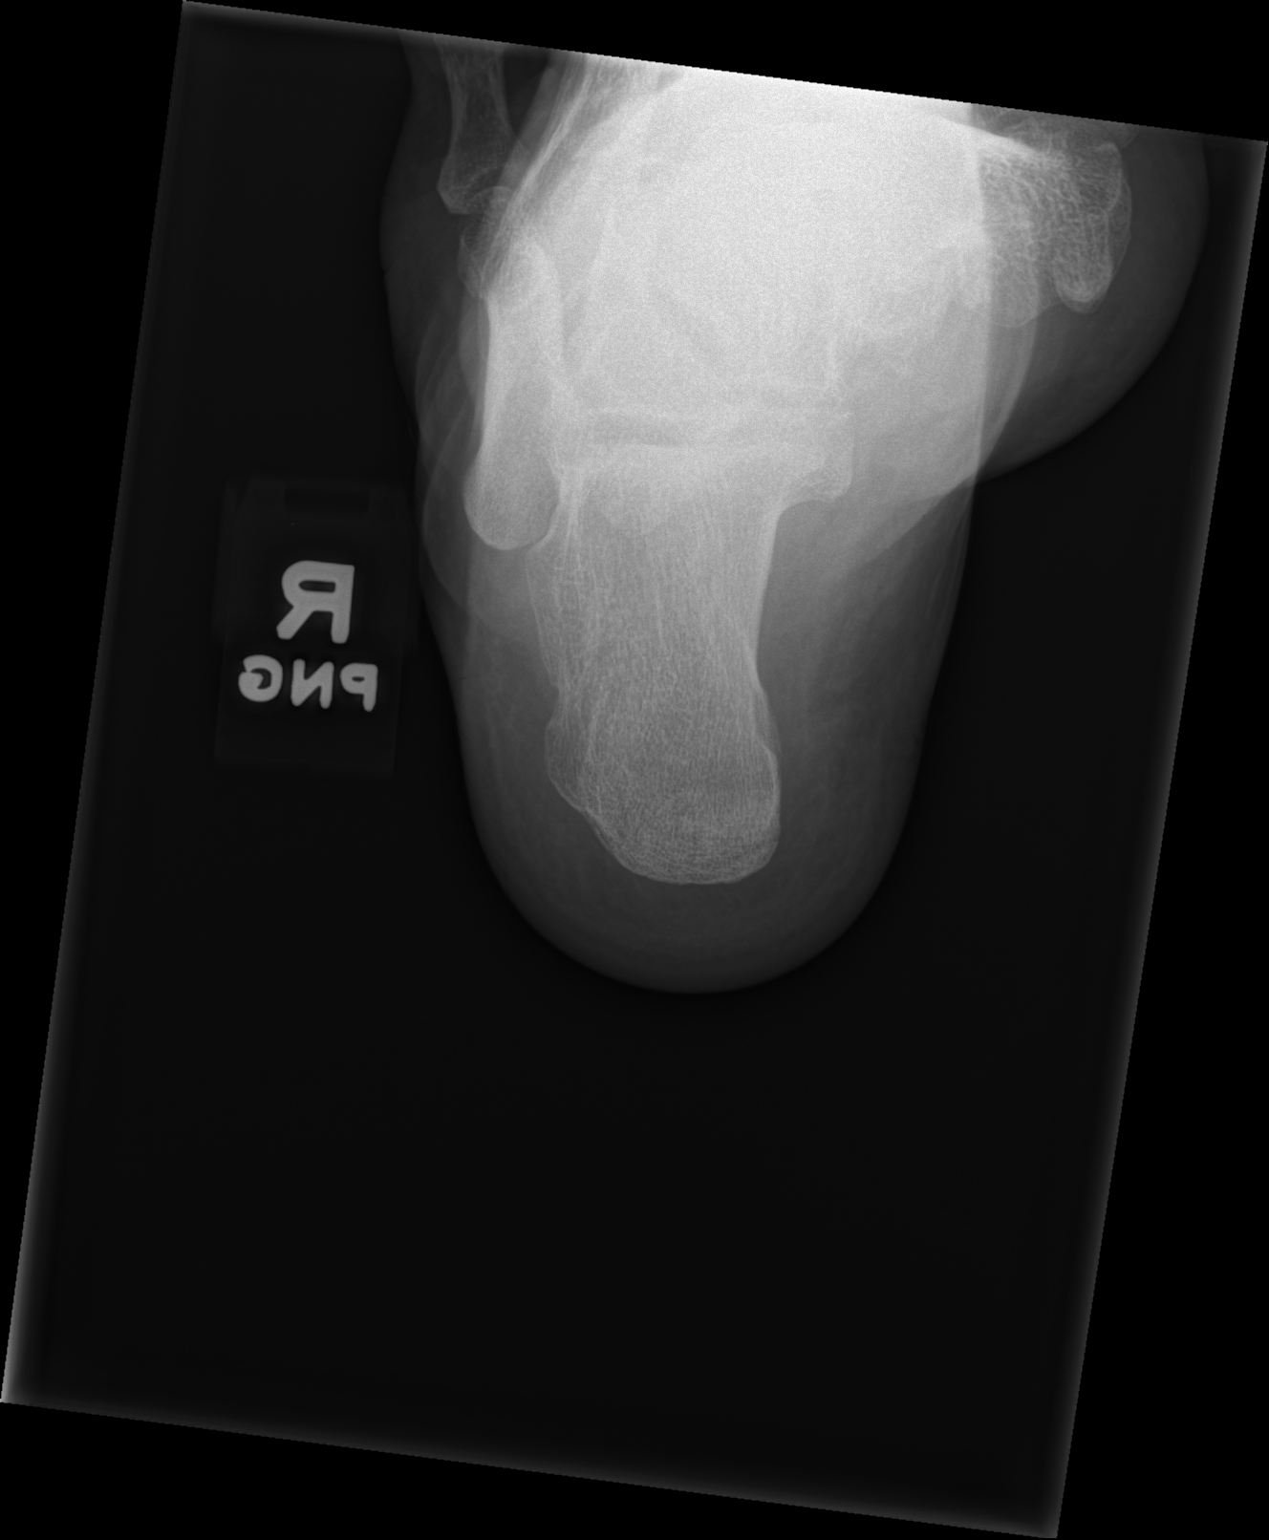

[x calcaneus lat right]
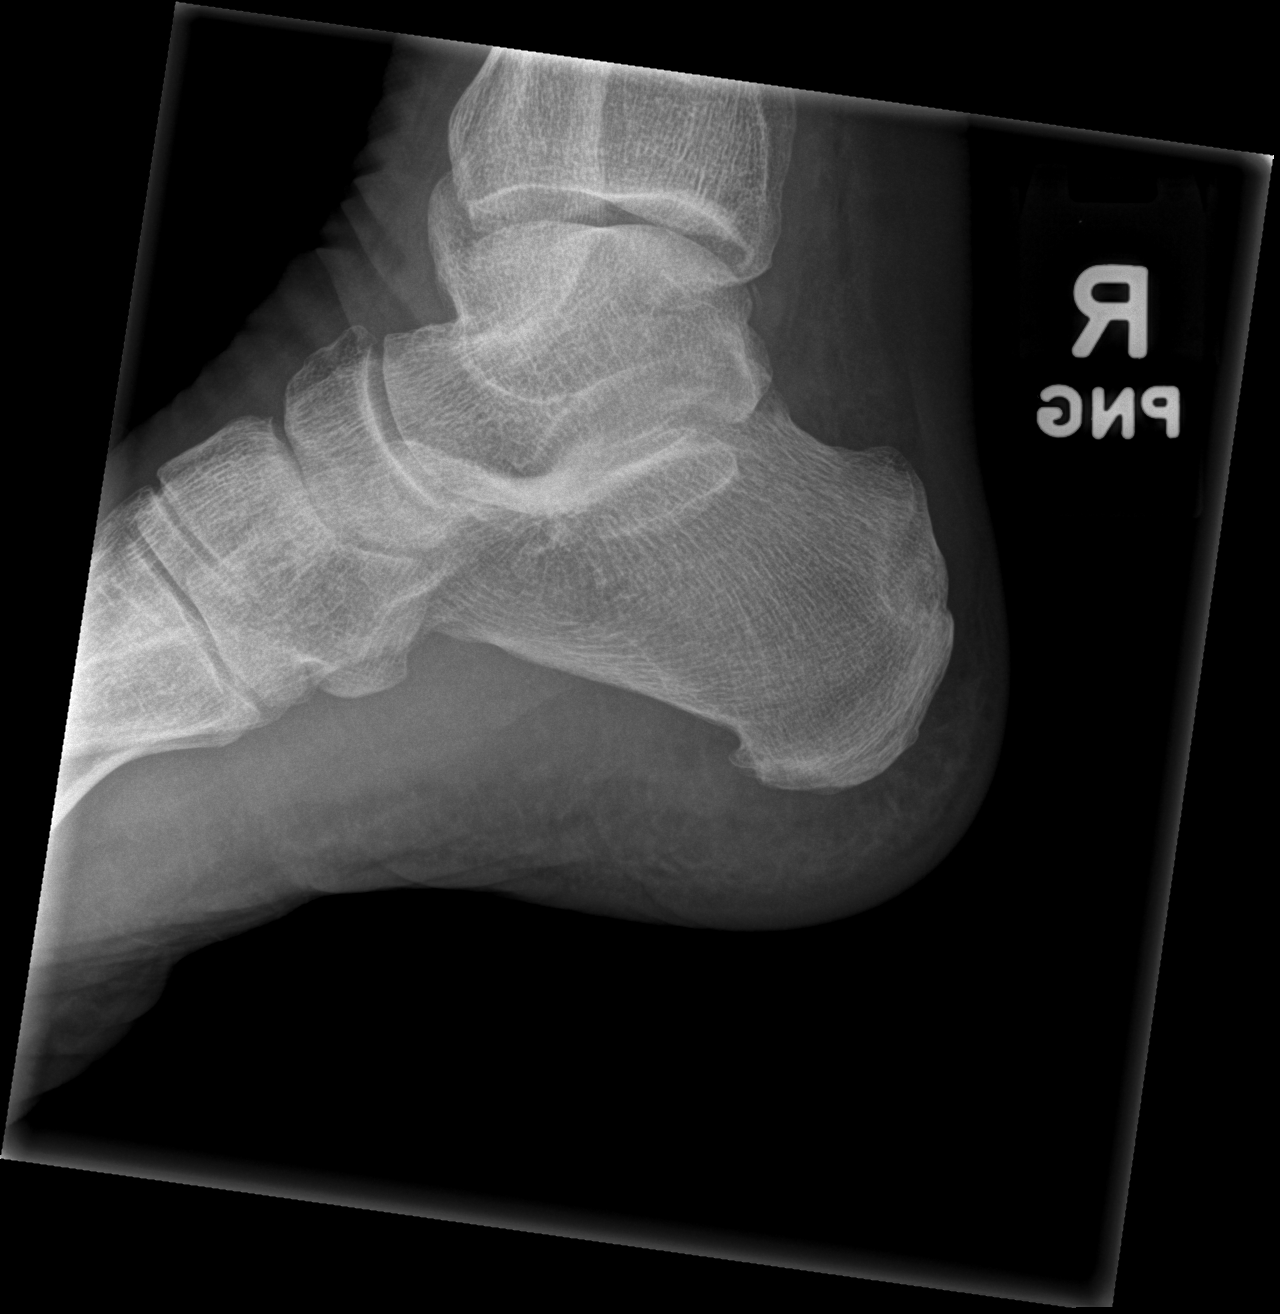

[2 of 2 positions shown; findings below may reference images not displayed]

FINDINGS: The right calcaneus appears intact without evidence of fracture.
Subtalar joint show normal alignment. No soft tissue abnormalities
are seen.
IMPRESSION: No evidence of right calcaneal fracture.

## 2015-02-22 IMAGING — CR DG ORBITS FOR FOREIGN BODY
2 series · 2 of 2 positions shown · non-contrast
Comparison: None.

CLINICAL DATA: Metal working/exposure; clearance prior to MRI

EXAM:
ORBITS FOR FOREIGN BODY - 2 VIEW

[x nasal bone lat (1 of 2)]
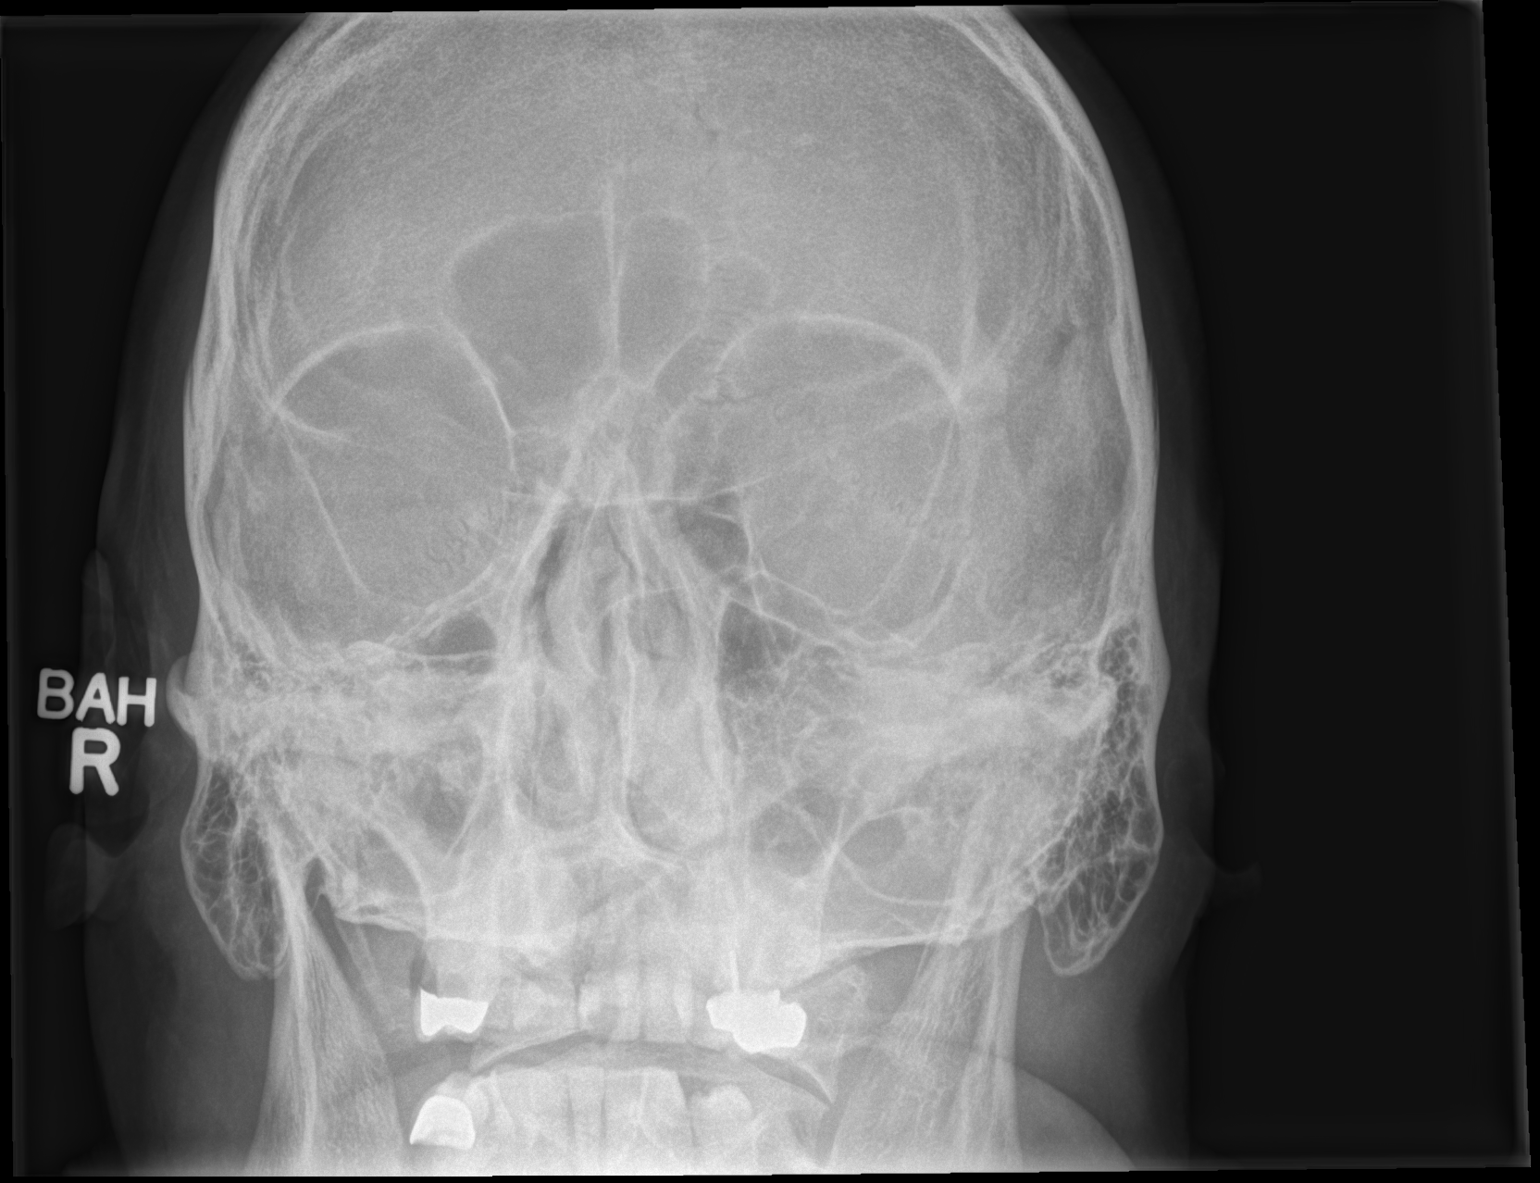

[x nasal bone lat (2 of 2)]
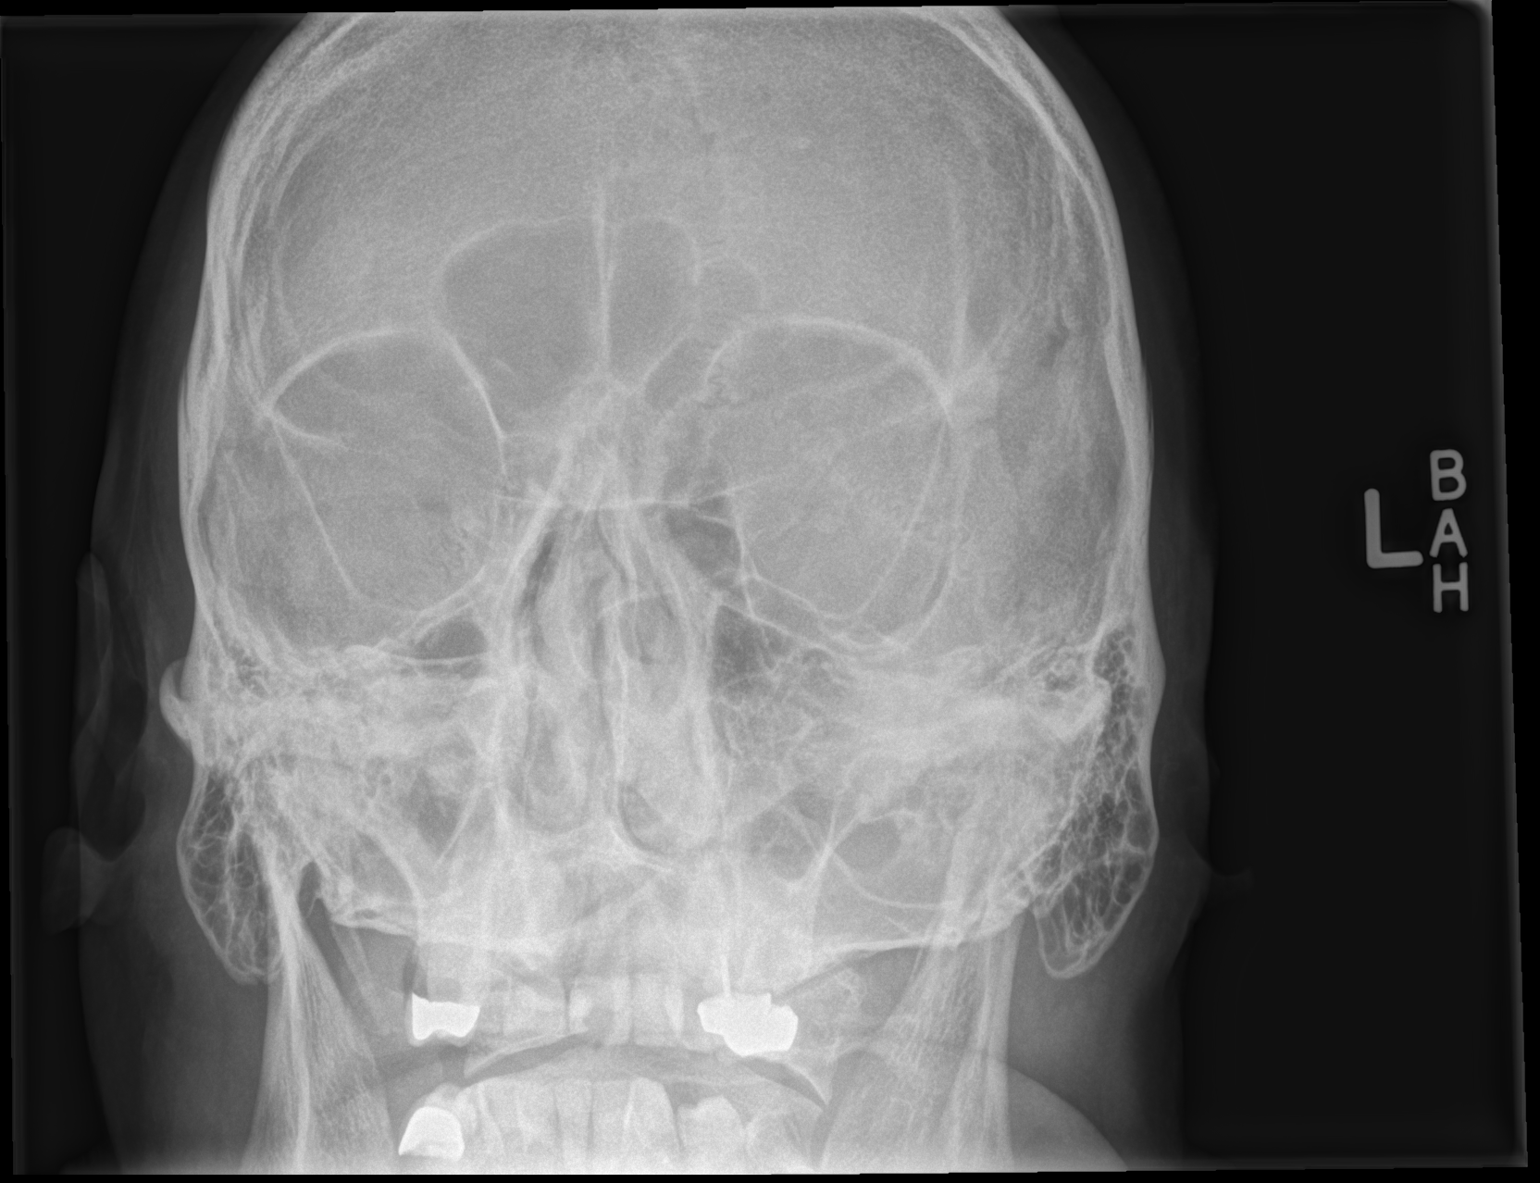

[2 of 2 positions shown; findings below may reference images not displayed]

FINDINGS: Water's views with eyes deviated toward the left and toward the
right were obtained. There is no intraorbital radiopaque foreign
body. No fracture or dislocation. There is mucosal thickening in the
inferior right maxillary antrum.
IMPRESSION: No evidence of metallic foreign body within the orbits. Right
maxillary sinus disease inferiorly.
